# Patient Record
Sex: Female | Born: 1975 | Race: White | Hispanic: No | Smoking: Former smoker
Health system: Southern US, Community
[De-identification: ages and names within clinical notes are randomized; demographics above are authoritative.]

## PROBLEM LIST (undated history)

## (undated) DIAGNOSIS — K5790 Diverticulosis of intestine, part unspecified, without perforation or abscess without bleeding: Secondary | ICD-10-CM

## (undated) DIAGNOSIS — R0600 Dyspnea, unspecified: Secondary | ICD-10-CM

## (undated) DIAGNOSIS — M797 Fibromyalgia: Secondary | ICD-10-CM

## (undated) DIAGNOSIS — F909 Attention-deficit hyperactivity disorder, unspecified type: Secondary | ICD-10-CM

## (undated) DIAGNOSIS — M5030 Other cervical disc degeneration, unspecified cervical region: Secondary | ICD-10-CM

## (undated) DIAGNOSIS — K219 Gastro-esophageal reflux disease without esophagitis: Secondary | ICD-10-CM

## (undated) DIAGNOSIS — F32A Depression, unspecified: Secondary | ICD-10-CM

## (undated) DIAGNOSIS — M5431 Sciatica, right side: Secondary | ICD-10-CM

## (undated) DIAGNOSIS — N63 Unspecified lump in unspecified breast: Secondary | ICD-10-CM

## (undated) DIAGNOSIS — O139 Gestational [pregnancy-induced] hypertension without significant proteinuria, unspecified trimester: Secondary | ICD-10-CM

## (undated) DIAGNOSIS — IMO0002 Reserved for concepts with insufficient information to code with codable children: Secondary | ICD-10-CM

## (undated) DIAGNOSIS — F419 Anxiety disorder, unspecified: Secondary | ICD-10-CM

## (undated) DIAGNOSIS — G43909 Migraine, unspecified, not intractable, without status migrainosus: Secondary | ICD-10-CM

## (undated) DIAGNOSIS — B977 Papillomavirus as the cause of diseases classified elsewhere: Secondary | ICD-10-CM

## (undated) DIAGNOSIS — F329 Major depressive disorder, single episode, unspecified: Secondary | ICD-10-CM

## (undated) DIAGNOSIS — Z8739 Personal history of other diseases of the musculoskeletal system and connective tissue: Secondary | ICD-10-CM

## (undated) DIAGNOSIS — D649 Anemia, unspecified: Secondary | ICD-10-CM

## (undated) DIAGNOSIS — R87619 Unspecified abnormal cytological findings in specimens from cervix uteri: Secondary | ICD-10-CM

## (undated) HISTORY — DX: Migraine, unspecified, not intractable, without status migrainosus: G43.909

## (undated) HISTORY — DX: Fibromyalgia: M79.7

## (undated) HISTORY — DX: Depression, unspecified: F32.A

## (undated) HISTORY — PX: DILATION AND CURETTAGE OF UTERUS: SHX78

## (undated) HISTORY — DX: Diverticulosis of intestine, part unspecified, without perforation or abscess without bleeding: K57.90

## (undated) HISTORY — DX: Anxiety disorder, unspecified: F41.9

## (undated) HISTORY — DX: Unspecified lump in unspecified breast: N63.0

## (undated) HISTORY — PX: OTHER SURGICAL HISTORY: SHX169

## (undated) HISTORY — PX: HAND SURGERY: SHX662

## (undated) HISTORY — DX: Sciatica, right side: M54.31

## (undated) HISTORY — DX: Reserved for concepts with insufficient information to code with codable children: IMO0002

## (undated) HISTORY — DX: Attention-deficit hyperactivity disorder, unspecified type: F90.9

## (undated) HISTORY — DX: Other cervical disc degeneration, unspecified cervical region: M50.30

## (undated) HISTORY — DX: Major depressive disorder, single episode, unspecified: F32.9

## (undated) HISTORY — PX: ABDOMINAL HYSTERECTOMY: SHX81

## (undated) HISTORY — DX: Unspecified abnormal cytological findings in specimens from cervix uteri: R87.619

---

## 2001-05-17 ENCOUNTER — Other Ambulatory Visit: Admission: RE | Admit: 2001-05-17 | Discharge: 2001-05-17 | Payer: Self-pay | Admitting: Obstetrics and Gynecology

## 2001-07-03 ENCOUNTER — Emergency Department (HOSPITAL_COMMUNITY): Admission: EM | Admit: 2001-07-03 | Discharge: 2001-07-04 | Payer: Self-pay | Admitting: *Deleted

## 2001-07-03 ENCOUNTER — Encounter: Payer: Self-pay | Admitting: *Deleted

## 2001-07-08 ENCOUNTER — Ambulatory Visit (HOSPITAL_COMMUNITY): Admission: RE | Admit: 2001-07-08 | Discharge: 2001-07-08 | Payer: Self-pay | Admitting: Orthopaedic Surgery

## 2001-08-21 ENCOUNTER — Encounter (HOSPITAL_COMMUNITY): Admission: RE | Admit: 2001-08-21 | Discharge: 2001-09-20 | Payer: Self-pay | Admitting: Orthopaedic Surgery

## 2002-06-23 ENCOUNTER — Encounter: Payer: Self-pay | Admitting: Family Medicine

## 2002-06-23 ENCOUNTER — Ambulatory Visit (HOSPITAL_COMMUNITY): Admission: RE | Admit: 2002-06-23 | Discharge: 2002-06-23 | Payer: Self-pay | Admitting: Family Medicine

## 2004-03-08 ENCOUNTER — Inpatient Hospital Stay (HOSPITAL_COMMUNITY): Admission: AD | Admit: 2004-03-08 | Discharge: 2004-03-10 | Payer: Self-pay | Admitting: Internal Medicine

## 2004-08-18 ENCOUNTER — Ambulatory Visit (HOSPITAL_COMMUNITY): Admission: RE | Admit: 2004-08-18 | Discharge: 2004-08-18 | Payer: Self-pay | Admitting: Obstetrics & Gynecology

## 2004-10-13 ENCOUNTER — Ambulatory Visit (HOSPITAL_COMMUNITY): Admission: RE | Admit: 2004-10-13 | Discharge: 2004-10-13 | Payer: Self-pay | Admitting: Obstetrics & Gynecology

## 2004-11-17 ENCOUNTER — Ambulatory Visit (HOSPITAL_COMMUNITY): Admission: RE | Admit: 2004-11-17 | Discharge: 2004-11-17 | Payer: Self-pay | Admitting: Family Medicine

## 2005-01-18 ENCOUNTER — Ambulatory Visit (HOSPITAL_COMMUNITY): Admission: RE | Admit: 2005-01-18 | Discharge: 2005-01-18 | Payer: Self-pay | Admitting: Family Medicine

## 2005-01-25 ENCOUNTER — Ambulatory Visit (HOSPITAL_COMMUNITY): Admission: RE | Admit: 2005-01-25 | Discharge: 2005-01-25 | Payer: Self-pay | Admitting: Family Medicine

## 2005-02-27 ENCOUNTER — Ambulatory Visit: Payer: Self-pay | Admitting: Internal Medicine

## 2005-03-30 ENCOUNTER — Ambulatory Visit: Payer: Self-pay | Admitting: Internal Medicine

## 2006-11-19 ENCOUNTER — Ambulatory Visit (HOSPITAL_COMMUNITY): Admission: RE | Admit: 2006-11-19 | Discharge: 2006-11-19 | Payer: Self-pay | Admitting: Family Medicine

## 2006-11-21 ENCOUNTER — Ambulatory Visit (HOSPITAL_COMMUNITY): Admission: RE | Admit: 2006-11-21 | Discharge: 2006-11-21 | Payer: Self-pay | Admitting: Family Medicine

## 2006-11-30 ENCOUNTER — Ambulatory Visit (HOSPITAL_COMMUNITY): Admission: RE | Admit: 2006-11-30 | Discharge: 2006-11-30 | Payer: Self-pay | Admitting: Family Medicine

## 2008-02-28 ENCOUNTER — Encounter (HOSPITAL_COMMUNITY): Admission: RE | Admit: 2008-02-28 | Discharge: 2008-03-29 | Payer: Self-pay | Admitting: Neurology

## 2008-03-19 ENCOUNTER — Ambulatory Visit (HOSPITAL_COMMUNITY): Admission: RE | Admit: 2008-03-19 | Discharge: 2008-03-19 | Payer: Self-pay | Admitting: Obstetrics and Gynecology

## 2008-06-16 ENCOUNTER — Other Ambulatory Visit: Admission: RE | Admit: 2008-06-16 | Discharge: 2008-06-16 | Payer: Self-pay | Admitting: Obstetrics & Gynecology

## 2009-06-29 ENCOUNTER — Other Ambulatory Visit: Admission: RE | Admit: 2009-06-29 | Discharge: 2009-06-29 | Payer: Self-pay | Admitting: Obstetrics & Gynecology

## 2009-11-12 ENCOUNTER — Ambulatory Visit (HOSPITAL_COMMUNITY): Admission: RE | Admit: 2009-11-12 | Discharge: 2009-11-12 | Payer: Self-pay | Admitting: Neurology

## 2010-06-03 ENCOUNTER — Emergency Department (HOSPITAL_COMMUNITY): Admission: EM | Admit: 2010-06-03 | Discharge: 2010-06-04 | Payer: Self-pay | Admitting: Emergency Medicine

## 2010-10-23 NOTE — L&D Delivery Note (Signed)
Delivery Note At 9:32 PM a viable female was delivered via Vaginal, Spontaneous Delivery (Presentation: ;LOA  ).  APGAR: , ; weight .   Placenta status:spont Intact, Spontaneous.3vc  Cord:  with the following complications:none .    Anesthesia:  none Episiotomy: none Lacerations: none Suture Repair: none Est. Blood Loss300 (mL):   Mom to postpartum.  Baby to nursery-stable.  Zerita Boers 07/23/2011, 9:46 PM

## 2010-12-08 LAB — ABO/RH: RH Type: POSITIVE

## 2010-12-08 LAB — HIV ANTIBODY (ROUTINE TESTING W REFLEX): HIV: NONREACTIVE

## 2011-01-02 ENCOUNTER — Other Ambulatory Visit: Payer: Self-pay | Admitting: Obstetrics & Gynecology

## 2011-01-02 ENCOUNTER — Other Ambulatory Visit (HOSPITAL_COMMUNITY)
Admission: RE | Admit: 2011-01-02 | Discharge: 2011-01-02 | Disposition: A | Discharge status: Home / Self Care | Payer: Medicaid Other | Source: Ambulatory Visit | Attending: Obstetrics & Gynecology | Admitting: Obstetrics & Gynecology

## 2011-01-02 DIAGNOSIS — Z113 Encounter for screening for infections with a predominantly sexual mode of transmission: Secondary | ICD-10-CM | POA: Insufficient documentation

## 2011-01-02 DIAGNOSIS — Z01419 Encounter for gynecological examination (general) (routine) without abnormal findings: Secondary | ICD-10-CM | POA: Insufficient documentation

## 2011-01-06 LAB — URINALYSIS, ROUTINE W REFLEX MICROSCOPIC
Leukocytes, UA: NEGATIVE
Protein, ur: NEGATIVE mg/dL
Urobilinogen, UA: 0.2 mg/dL (ref 0.0–1.0)

## 2011-01-06 LAB — POCT I-STAT, CHEM 8
BUN: 8 mg/dL (ref 6–23)
Calcium, Ion: 1.14 mmol/L (ref 1.12–1.32)
Creatinine, Ser: 0.8 mg/dL (ref 0.4–1.2)
Glucose, Bld: 92 mg/dL (ref 70–99)
TCO2: 27 mmol/L (ref 0–100)

## 2011-01-06 LAB — URINE MICROSCOPIC-ADD ON

## 2011-03-10 NOTE — H&P (Signed)
NAME:  Kathryn Golden, Kathryn Golden                             ACCOUNT NO.:  [id number]   MEDICAL RECORD NO.:  [id number]                  PATIENT TYPE:   LOCATION:                                       FACILITY:   PHYSICIAN:  Lazaro Arms, M.D.                DATE OF BIRTH:  1976-07-03   DATE OF ADMISSION:  DATE OF DISCHARGE:                                HISTORY & PHYSICAL   HISTORY:  The patient is a 35 year old white female gravida 4, para 1,  abortus 2, with estimated date of delivery of 03/27/2004 currently 37-2/[redacted]  weeks gestation who presented to the office this morning complaining of  spontaneous rupture of membranes at approximately 6:30 this morning. She is  not reporting any labor.  In evaluation in the office she is obviously  ruptured with clear amniotic fluid. She is group B strep positive from  previous pregnancy.  As a result, she is admitted for antibiotic prophylaxis  and induction of labor with expected management.   PAST SURGICAL HISTORY:  In the right hand.   PAST OBSTETRICAL HISTORY:  A vaginal delivery in 2002, 7 pounds 15 ounces, 6  hours.  She has had 2 miscarriages.   ALLERGIES:  None.   MEDICATIONS:  Prenatal vitamins.   REVIEW OF SYSTEMS:  Negative.  Blood type is O positive.  Antibody screen is  negative.  HIV is nonreactive.  Hepatitis B was negative.  Rubella is  immune.  Pap was normal.  Serology was nonreactive.  GC and Chlamydia were  negative.  AFP was normal.  Glucola was normal; and group B strep was prior  positive.   PHYSICAL EXAMINATION:  HEENT:  Unremarkable.  NECK:  Thyroid is normal.  VITAL SIGNS:  Weight 146 pounds.  Blood pressure 130/70.  HEENT:  Unremarkable.  BREASTS:  Exam deferred.  ABDOMEN:  Fundal height 37 cm.  Cervix is 250 minus 1 to minus 2 vertex,  firm, midplane.  EXTREMITIES: Warm no edema.   IMPRESSION:  1. An intrauterine pregnancy at 37-2/[redacted] weeks gestation.  2. Spontaneous rupture of membranes.  3. No labor.   PLAN:   The patient is admitted for antibiotic prophylaxis for positive group  B strep and also induction of labor with Pitocin. She will request an  epidural at the appropriate time.     ___________________________________________                                         Lazaro Arms, M.D.   Loraine Maple  D:  03/08/2004  T:  03/08/2004  Job:  [id number]

## 2011-03-10 NOTE — Op Note (Signed)
NAME:  Kathryn Golden, Kathryn Golden                 ACCOUNT NO.:  [id number]   MEDICAL RECORD NO.:  [id number]          PATIENT TYPE:  AMB   LOCATION:  DAY                           FACILITY:  [facility]   PHYSICIAN:  Lazaro Arms, M.D.   DATE OF BIRTH:  1976-06-08   DATE OF PROCEDURE:  08/18/2004  DATE OF DISCHARGE:                                 OPERATIVE REPORT   PREOPERATIVE DIAGNOSES:  1.  Menometrorrhagia.  2.  Possible retained fragment of placenta.   POSTOPERATIVE DIAGNOSES:  1.  Menometrorrhagia.  2.  Possible retained fragment of placenta.   PROCEDURE:  Hysteroscopy and dilatation and curettage.   SURGEON:  Lazaro Arms, M.D.   ANESTHESIA:  Laryngeal mask airway.   FINDINGS:  The patient delivered several months ago and did well postpartum,  and she began to have unusual heavy bleeding when she started birth control  pills.  I put her on Megace to get her bleeding stopped, which worked  nicely, did an ultrasound which showed a little tiny area of calcification  in the anterior endometrial stripe, but she responded so nicely to the  Megace, I thought that there probably was not a retained fragment.  She  stayed on the Megace for a couple of months, came off of it and started  bleeding again, and now I think it probably is.  As a result, she is  admitted for hysteroscopy, D&C.   During the hysteroscopy, there was some shaggy-looking tissue on the  anterior uterine wall about midway the anterior uterine wall, not at the  fundus.  There probably was a very small retained placental fragment of  tissue.  It was removed with the D&C.   DESCRIPTION OF OPERATION:  The patient was taken to the operating room and  placed in the supine position, where she underwent laryngeal mask airway.  She was then placed in the dorsal lithotomy position and prepped and draped  in the usual fashion.  The bladder was drained, a Graves speculum was  placed.  The cervix was grasped with a single-tooth  tenaculum.  Marcaine  0.5% was injected as a paracervical block 20 mL total.  The cervix was  dilated, hysteroscope was placed, and the above-noted findings were seen.  A  vigorous uterine curettage was performed, and there was good uterine cry  attained in all areas.  A look with the hysteroscope again, and the fragment  and all the  tissue with it had been removed.  She was down to white endometrium.  As a  result, the procedure was found to be finished.  It went well without  difficulty.  The patient was awakened from anesthesia, taken to recovery in  good, stable condition, all counts correct.  She received Ancef and Toradol  prophylactically.     Luth   LHE/MEDQ  D:  08/18/2004  T:  08/18/2004  Job:  [id number]

## 2011-03-10 NOTE — Op Note (Signed)
NAME:  [name], [name]                           ACCOUNT NO.:  [id number]   MEDICAL RECORD NO.:  [id number]                   PATIENT TYPE:  INP   LOCATION:  A417                                 FACILITY:  [facility]   PHYSICIAN:  Tilda Burrow, M.D.              DATE OF BIRTH:  11-13-1975   DATE OF PROCEDURE:  DATE OF DISCHARGE:                                 OPERATIVE REPORT   DELIVERY NOTE:  Kathryn Golden progressed steadily through labor, receiving her  epidural at approximately 1800 when she was 4 cm dilated and after Pitocin  augmentation, was noted to be fully dilated at approximately 1925.  She was  having variable decelerations of the fetal heart rate with each contraction  down to about the 90s with return to baseline before the end of the  contractions.  After a very brief 10 minute second stage, she delivered a  viable female infant at 8.  Apgars were 9 and 9; weight was 6 pounds 8.6  ounces.  There was a nuchal cord that was reduced before delivery of the  body.  The nose and mouth were suctioned on the perineum and the body  delivered without difficulty.  Pitocin 20 units diluted in 1000 mL of  lactated Ringer's was then infused rapidly IV.  The placenta separated  spontaneously and was delivered via controlled cord traction at 1955.  It  was inspected and appeared to be intact with a three-vessel cord.  Lochia  flow was minimal.  The epidural catheter was then removed with the blue tip  visualized as being intact.  Estimated blood loss 200 mL.  The vagina was  inspected, and no lacerations were found.     ________________________________________  ___________________________________________  Jacklyn Shell, C.N.M.           Tilda Burrow, M.D.   FC/MEDQ  D:  03/08/2004  T:  03/09/2004  Job:  [id number]

## 2011-03-10 NOTE — Op Note (Signed)
NAME:  Kathryn Golden, Kathryn Golden                           ACCOUNT NO.:  [id number]   MEDICAL RECORD NO.:  [id number]                   PATIENT TYPE:  INP   LOCATION:  A411                                 FACILITY:  [facility]   PHYSICIAN:  Tilda Burrow, M.D.              DATE OF BIRTH:  1976/07/13   DATE OF PROCEDURE:  03/08/2004  DATE OF DISCHARGE:  03/10/2004                                 OPERATIVE REPORT   PROCEDURE:  Epidural note, 6 p.m., Mar 08, 2004   DETAILS OF PROCEDURE:  The patient was placed in the sitting position,  flexed forward, and back prepped and draped.  Loss of resistance technique  was used to identify the epidural space on the first attempt with standard  loss of resistance technique.  Then 5 mL of 1.5% lidocaine with epinephrine  was instilled followed by placement of the epidural catheter 3 cm into the  epidural space, 2 at removal of the Tuohy needle done by taping the catheter  to the back and infusing a 10 mL bolus of the standard epidural mix of  0.125% Marcaine with fentanyl.  Subsequently, she was placed on 12 mL/h of  continuous infusion with good analgesic relief at T10 level.  The patient  tolerated the procedure well and continued with labor management as  documented elsewhere.      ___________________________________________                                            Tilda Burrow, M.D.   JVF/MEDQ  D:  03/30/2004  T:  03/30/2004  Job:  [id number]

## 2011-07-11 LAB — GC/CHLAMYDIA PROBE AMP, GENITAL
Chlamydia: NEGATIVE
Gonorrhea: NEGATIVE

## 2011-07-18 ENCOUNTER — Encounter (HOSPITAL_COMMUNITY): Payer: Self-pay | Admitting: *Deleted

## 2011-07-18 ENCOUNTER — Inpatient Hospital Stay (HOSPITAL_COMMUNITY)
Admission: AD | Admit: 2011-07-18 | Discharge: 2011-07-19 | Disposition: A | Discharge status: Home / Self Care | Payer: Medicaid Other | Source: Ambulatory Visit | Attending: Obstetrics & Gynecology | Admitting: Obstetrics & Gynecology

## 2011-07-18 DIAGNOSIS — O99891 Other specified diseases and conditions complicating pregnancy: Secondary | ICD-10-CM | POA: Insufficient documentation

## 2011-07-18 DIAGNOSIS — R030 Elevated blood-pressure reading, without diagnosis of hypertension: Secondary | ICD-10-CM

## 2011-07-18 DIAGNOSIS — R0989 Other specified symptoms and signs involving the circulatory and respiratory systems: Secondary | ICD-10-CM

## 2011-07-18 LAB — PROTEIN / CREATININE RATIO, URINE: Total Protein, Urine: <4 mg/dL

## 2011-07-18 MED ORDER — OXYCODONE-ACETAMINOPHEN 5-325 MG PO TABS: 1.0000 | ORAL_TABLET | ORAL | Status: DC | PRN

## 2011-07-18 NOTE — Progress Notes (Signed)
Pt states she has a headache that she has had all day. Pt states she has had headaches since Sunday 0/23/2012

## 2011-07-18 NOTE — Progress Notes (Signed)
Pt states she was seen in office today and her b/p was 150/74, they got lab work and told her they would call her but she did not hear anything and she got worried. Pt reports she has had a headache off/on for 3 days and has had "flashes of light and blurred vision" for days also.

## 2011-07-18 NOTE — ED Provider Notes (Signed)
Kathryn Golden is a 35 y.o. female presenting for further eval of elevated BP from her office visit earlier today. Reports H/A since 9/23 and blurry vision x 2 wks. Denies leak or bldg. Reports +FM. Had blood drawn at [facility] earlier today but did not receive a phone call with results and she became concerned. Maternal Medical History:  Reason for admission: Reason for Admission:   nausea  OB History    Grav Para Term Preterm Abortions TAB SAB Ect Mult Living   5 2 2  0 2 0 2 0 0 2     No past medical history on file. Past Surgical History  Procedure Date  . Dilation and curretage    Family History: family history includes Cancer in her father. Social History:  reports that she has been smoking.  She does not have any smokeless tobacco history on file. She reports that she does not drink alcohol or use illicit drugs.  Review of Systems  Constitutional: Negative for fever.  Gastrointestinal: Negative for nausea and vomiting.      Blood pressure 138/71, pulse 83, temperature 98 F (36.7 C), resp. rate 18, height 5\' 7"  (1.702 m), weight 76.204 kg (168 lb). Serial BPs: 138-149/71-85 Maternal Exam:  Uterine Assessment: q 2-3 min ctx initially which spaced to irreg 6-8 at the end of her visit     Fetal Exam Fetal Monitor Review: Baseline rate: 130.  Variability: moderate (6-25 bpm).   Pattern: accelerations present and no decelerations.    Fetal State Assessment: Category I - tracings are normal.     Physical Exam  Constitutional: She is oriented to person, place, and time. She appears well-developed and well-nourished.  HENT:  Head: Normocephalic.  Cardiovascular: Normal rate.   Respiratory: Effort normal.  Musculoskeletal: Normal range of motion.  Neurological: She is alert and oriented to person, place, and time.  Psychiatric: She has a normal mood and affect. Her behavior is normal.    Urine Pro/Cr ratio: protein reported as <4.0 (rev'd with [place] CNM)  Prenatal  labs: ABO, Rh:   Antibody:   Rubella:   RPR:    HBsAg:    HIV:    GBS:     Assessment/Plan: IUP at 37.4 Labile BP  Offered medication for HA but pt declined. States she mostly wanted to eval her BP.  D/C home with preeclampsia precautions. FU at [facility] as sched or sooner if symptoms worsen.   Cam Hai 07/18/2011, 11:38 PM

## 2011-07-18 NOTE — Progress Notes (Signed)
EFM Strip reviewed by K. Clelia Croft CNM as second reviewer and removed from [facility].

## 2011-07-21 NOTE — ED Provider Notes (Signed)
Attestation of Attending Supervision of Advanced Practitioner: Evaluation and management procedures were performed by the PA/NP/CNM/OB Fellow under my supervision/collaboration. Chart reviewed and agree with management and plan.  Aikeem Lilley A 07/21/2011 3:13 PM   

## 2011-07-22 ENCOUNTER — Encounter (HOSPITAL_COMMUNITY): Payer: Self-pay | Admitting: *Deleted

## 2011-07-22 ENCOUNTER — Inpatient Hospital Stay (HOSPITAL_COMMUNITY)
Admission: AD | Admit: 2011-07-22 | Discharge: 2011-07-23 | Disposition: A | Discharge status: Home / Self Care | Payer: Medicaid Other | Source: Ambulatory Visit | Attending: Obstetrics & Gynecology | Admitting: Obstetrics & Gynecology

## 2011-07-22 DIAGNOSIS — E876 Hypokalemia: Secondary | ICD-10-CM | POA: Insufficient documentation

## 2011-07-22 DIAGNOSIS — R439 Unspecified disturbances of smell and taste: Secondary | ICD-10-CM | POA: Insufficient documentation

## 2011-07-22 DIAGNOSIS — O99891 Other specified diseases and conditions complicating pregnancy: Secondary | ICD-10-CM | POA: Insufficient documentation

## 2011-07-22 DIAGNOSIS — N898 Other specified noninflammatory disorders of vagina: Secondary | ICD-10-CM | POA: Insufficient documentation

## 2011-07-22 HISTORY — DX: Gestational (pregnancy-induced) hypertension without significant proteinuria, unspecified trimester: O13.9

## 2011-07-22 HISTORY — DX: Papillomavirus as the cause of diseases classified elsewhere: B97.7

## 2011-07-22 LAB — URINALYSIS, ROUTINE W REFLEX MICROSCOPIC
Glucose, UA: NEGATIVE mg/dL
Hgb urine dipstick: NEGATIVE
Leukocytes, UA: NEGATIVE
Nitrite: NEGATIVE
Protein, ur: NEGATIVE mg/dL
Specific Gravity, Urine: <1.005 — ABNORMAL LOW (ref 1.005–1.030)

## 2011-07-22 NOTE — Progress Notes (Signed)
C/o chest tightness today, states face goes numb, feels like a hair across face.  Also green vag d/c intermittently, continuous today.

## 2011-07-22 NOTE — ED Provider Notes (Signed)
History     Chief Complaint  Patient presents with  . Chest Pain  . Numbness   HPI Kathryn Golden is a 35 year old G5P2022 at [redacted]w[redacted]d presenting with perioral and right hand numbness as well as feeling like her right hand and right foot are weak x1-2 days.  More swelling in her right foot that is painful.  Also states her mouth feels twitchy.  Also feels chest tightness and shortness of breath that comes and goes.  Has a greenish to white vaginal discharge without vaginal itching or burning.  Denies loss of fluid and vaginal bleeding.  Having mild contractions.  Reports good fetal movement.  Does have some nausea, able to snack on small meals throughout the day.  Drinking lots of water.  OB History    Grav Para Term Preterm Abortions TAB SAB Ect Mult Living   5 2 2  0 2 0 2 0 0 2      Past Medical History  Diagnosis Date  . PIH (pregnancy induced hypertension)   . Endometriosis   . HPV (human papilloma virus) infection     Past Surgical History  Procedure Date  . Dilation and curretage   . Dilation and curettage of uterus   . Hand surgery     Family History  Problem Relation Age of Onset  . Cancer Father     History  Substance Use Topics  . Smoking status: Current Everyday Smoker -- 1.0 packs/day  . Smokeless tobacco: Never Used  . Alcohol Use: No    Allergies: No Known Allergies  Prescriptions prior to admission  Medication Sig Dispense Refill  . acetaminophen (TYLENOL) 500 MG tablet Take 500 mg by mouth every 6 (six) hours as needed. For pain or headache       . escitalopram (LEXAPRO) 20 MG tablet Take 20 mg by mouth daily.        Marland Kitchen esomeprazole (NEXIUM) 40 MG capsule Take 40 mg by mouth at bedtime.        . flintstones complete (FLINTSTONES) 60 MG chewable tablet Chew 2 tablets by mouth daily.        Marland Kitchen zolpidem (AMBIEN) 10 MG tablet Take 10 mg by mouth at bedtime as needed. For insomnia         Review of Systems  Constitutional: Negative for fever and chills.  Eyes:  Positive for blurred vision (present for several weeks).  Respiratory: Positive for shortness of breath. Negative for cough.   Cardiovascular: Negative for chest pain and palpitations.  Gastrointestinal: Positive for nausea and diarrhea (watery to loose stools for last week). Negative for vomiting, abdominal pain and constipation.  Genitourinary: Negative for dysuria.  Skin: Negative for rash.  Neurological: Positive for tingling. Negative for headaches.   Physical Exam   Blood pressure 140/93, pulse 95, resp. rate 24, height 5\' 7"  (1.702 m), weight 171 lb (77.565 kg), SpO2 99.00%.  Physical Exam  Constitutional: She is oriented to person, place, and time. She appears well-developed and well-nourished. She appears distressed (appears mildly short of breath).  HENT:  Head: Normocephalic and atraumatic.  Mouth/Throat: Oropharynx is clear and moist. No oropharyngeal exudate.  Eyes: No scleral icterus.  Neck: Normal range of motion. Neck supple.  Cardiovascular: Normal rate, regular rhythm, normal heart sounds and intact distal pulses.  Exam reveals no gallop.   No murmur heard. Respiratory: Effort normal and breath sounds normal. She has no wheezes. She has no rales.       Appears mildly  short of breath   GI: Soft. Bowel sounds are normal. There is no tenderness.       Gravid with size consistent with dates  Genitourinary:       Normal external genitalia.  Normal vagina.  Thin white/greenish discharge present.    Musculoskeletal: She exhibits edema (pedal edema bilaterally, worse on right than left) and tenderness (dorsum of right foot).  Neurological: She is alert and oriented to person, place, and time.       No facial asymmetry.  Decreased peri-oral sensation. Mild right facial twitch with tapping on right facial nerve; not consistent.  Strength 5/5 in all four extremities.   Skin: Skin is warm and dry. No rash noted.  Psychiatric: She has a normal mood and affect. Her behavior is  normal.  Dilation: 2.5 Effacement (%): 50 Presentation: Vertex Exam by:: dr booth  FHT: 120, moderate variability, accels present, decels absent Contractions: every 5 minutes  MAU Course  Procedures Fern test: negative Wet Prep: no yeast, trich, or clue cells; few WBCs Urine dipstick shows positive for ketones, negative for protein. CBC    Component Value Date/Time   WBC 9.8 07/22/2011 2350   RBC 3.76* 07/22/2011 2350   HGB 11.4* 07/22/2011 2350   HCT 34.4* 07/22/2011 2350   PLT 189 07/22/2011 2350   MCV 91.5 07/22/2011 2350   MCH 30.3 07/22/2011 2350   MCHC 33.1 07/22/2011 2350   RDW 13.3 07/22/2011 2350   CMP     Component Value Date/Time   NA 136 07/22/2011 2350   K 2.2* 07/22/2011 2350   CL 101 07/22/2011 2350   CO2 26 07/22/2011 2350   GLUCOSE 84 07/22/2011 2350   BUN 3* 07/22/2011 2350   CREATININE <0.47* 07/22/2011 2350   CALCIUM 8.8 07/22/2011 2350   PROT 6.4 07/22/2011 2350   ALBUMIN 2.6* 07/22/2011 2350   AST 24 07/22/2011 2350   ALT 13 07/22/2011 2350   ALKPHOS 149* 07/22/2011 2350   BILITOT 0.3 07/22/2011 2350   GFRNONAA NOT CALCULATED 07/22/2011 2350   GFRAA NOT CALCULATED 07/22/2011 2350    MDM Patient with multiple complaints.  Respiratory complaints not worrisome given normal lung exam and O2 sats >95%.  Vaginal discharge un-alarming.  Neurologic symptoms somewhat concerning, however no evidence for motor weakness or stroke on exam.  Some concern for hypocalcemia given perioral paresthesia and +/- Chvosteck's sign.  Hypokalemia on CMP, will give oral replacement with now.  Assessment and Plan  35 year old G5P2022 at 37.1 presenting with multiple concerns Rh+, rubella immune, GBS positive -vaginal discharge: physiologic -respiratory status: stable, likely secondary to pregnancy -peri-oral numbness: unclear etiology, however no red flags for TIA/stroke -hypokalemia: prior to discharge; start PO daily x5 days -discharge home -follow up at [facility] as  scheduled on Monday   BOOTH, Avari Nevares 07/22/2011, 11:18 PM

## 2011-07-23 ENCOUNTER — Inpatient Hospital Stay (HOSPITAL_COMMUNITY)
Admission: AD | Admit: 2011-07-23 | Discharge: 2011-07-25 | DRG: 767 | Disposition: A | Discharge status: Home / Self Care | Payer: Medicaid Other | Source: Ambulatory Visit | Attending: Obstetrics & Gynecology | Admitting: Obstetrics & Gynecology

## 2011-07-23 ENCOUNTER — Encounter (HOSPITAL_COMMUNITY): Payer: Self-pay | Admitting: *Deleted

## 2011-07-23 DIAGNOSIS — Z302 Encounter for sterilization: Secondary | ICD-10-CM

## 2011-07-23 LAB — COMPREHENSIVE METABOLIC PANEL WITH GFR
ALT: 13 U/L (ref 0–35)
AST: 24 U/L (ref 0–37)
Albumin: 2.6 g/dL — ABNORMAL LOW (ref 3.5–5.2)
Alkaline Phosphatase: 149 U/L — ABNORMAL HIGH (ref 39–117)
BUN: 3 mg/dL — ABNORMAL LOW (ref 6–23)
CO2: 26 meq/L (ref 19–32)
Calcium: 8.8 mg/dL (ref 8.4–10.5)
Chloride: 101 meq/L (ref 96–112)
Creatinine, Ser: <0.47 mg/dL — ABNORMAL LOW (ref 0.50–1.10)
Glucose, Bld: 84 mg/dL (ref 70–99)
Potassium: 2.2 meq/L — CL (ref 3.5–5.1)
Sodium: 136 meq/L (ref 135–145)
Total Bilirubin: 0.3 mg/dL (ref 0.3–1.2)
Total Protein: 6.4 g/dL (ref 6.0–8.3)

## 2011-07-23 LAB — CBC
MCH: 30.3 pg (ref 26.0–34.0)
MCV: 91.5 fL (ref 78.0–100.0)
Platelets: 189 10*3/uL (ref 150–400)
RDW: 13.3 % (ref 11.5–15.5)

## 2011-07-23 LAB — COMPREHENSIVE METABOLIC PANEL
Alkaline Phosphatase: 162 U/L — ABNORMAL HIGH (ref 39–117)
BUN: <3 mg/dL — ABNORMAL LOW (ref 6–23)
CO2: 26 mEq/L (ref 19–32)
Chloride: 103 mEq/L (ref 96–112)
Creatinine, Ser: <0.47 mg/dL — ABNORMAL LOW (ref 0.50–1.10)
Glucose, Bld: 112 mg/dL — ABNORMAL HIGH (ref 70–99)
Total Bilirubin: 0.4 mg/dL (ref 0.3–1.2)

## 2011-07-23 LAB — WET PREP, GENITAL
Clue Cells Wet Prep HPF POC: NONE SEEN
Trich, Wet Prep: NONE SEEN
Yeast Wet Prep HPF POC: NONE SEEN

## 2011-07-23 MED ORDER — OXYTOCIN 20 UNITS IN LACTATED RINGERS INFUSION - SIMPLE: 125.0000 mL/h | Freq: Once | INTRAVENOUS | Status: DC

## 2011-07-23 MED ORDER — OXYCODONE-ACETAMINOPHEN 5-325 MG PO TABS: 2.0000 | ORAL_TABLET | ORAL | Status: DC | PRN

## 2011-07-23 MED ORDER — DIBUCAINE 1 % RE OINT
1.0000 "application " | TOPICAL_OINTMENT | RECTAL | Status: DC | PRN
  Filled 2011-07-23: qty 28

## 2011-07-23 MED ORDER — WITCH HAZEL-GLYCERIN EX PADS: 1.0000 "application " | MEDICATED_PAD | CUTANEOUS | Status: DC | PRN

## 2011-07-23 MED ORDER — FLEET ENEMA 7-19 GM/118ML RE ENEM: 1.0000 | ENEMA | RECTAL | Status: DC | PRN

## 2011-07-23 MED ORDER — LANOLIN HYDROUS EX OINT: TOPICAL_OINTMENT | CUTANEOUS | Status: DC | PRN

## 2011-07-23 MED ORDER — CITRIC ACID-SODIUM CITRATE 334-500 MG/5ML PO SOLN: 30.0000 mL | ORAL | Status: DC | PRN

## 2011-07-23 MED ORDER — ESCITALOPRAM OXALATE 20 MG PO TABS
20.0000 mg | ORAL_TABLET | Freq: Every day | ORAL | Status: DC
  Administered 2011-07-24 – 2011-07-25 (×3): 20 mg via ORAL
  Filled 2011-07-23 (×4): qty 1

## 2011-07-23 MED ORDER — ONDANSETRON HCL 4 MG/2ML IJ SOLN: 4.0000 mg | INTRAMUSCULAR | Status: DC | PRN

## 2011-07-23 MED ORDER — ONDANSETRON HCL 4 MG PO TABS: 4.0000 mg | ORAL_TABLET | ORAL | Status: DC | PRN

## 2011-07-23 MED ORDER — LACTATED RINGERS IV SOLN
500.0000 mL | INTRAVENOUS | Status: DC | PRN
Start: 2011-07-23 — End: 2011-07-23

## 2011-07-23 MED ORDER — SIMETHICONE 80 MG PO CHEW
80.0000 mg | CHEWABLE_TABLET | ORAL | Status: DC | PRN
  Administered 2011-07-24: 80 mg via ORAL

## 2011-07-23 MED ORDER — IBUPROFEN 600 MG PO TABS: 600.0000 mg | ORAL_TABLET | Freq: Four times a day (QID) | ORAL | Status: DC | PRN

## 2011-07-23 MED ORDER — LACTATED RINGERS IV SOLN
INTRAVENOUS | Status: DC
Start: 2011-07-23 — End: 2011-07-23

## 2011-07-23 MED ORDER — SODIUM CHLORIDE 0.9 % IV SOLN
2.0000 g | Freq: Once | INTRAVENOUS | Status: DC
  Filled 2011-07-23: qty 2000

## 2011-07-23 MED ORDER — ZOLPIDEM TARTRATE 5 MG PO TABS: 5.0000 mg | ORAL_TABLET | Freq: Every evening | ORAL | Status: DC | PRN

## 2011-07-23 MED ORDER — LIDOCAINE HCL (PF) 1 % IJ SOLN: 30.0000 mL | INTRAMUSCULAR | Status: DC | PRN

## 2011-07-23 MED ORDER — TETANUS-DIPHTH-ACELL PERTUSSIS 5-2.5-18.5 LF-MCG/0.5 IM SUSP
0.5000 mL | Freq: Once | INTRAMUSCULAR | Status: AC
  Administered 2011-07-25: 0.5 mL via INTRAMUSCULAR
  Filled 2011-07-23 (×2): qty 0.5

## 2011-07-23 MED ORDER — PANTOPRAZOLE SODIUM 40 MG PO TBEC
80.0000 mg | DELAYED_RELEASE_TABLET | Freq: Every day | ORAL | Status: DC
  Administered 2011-07-24: 80 mg via ORAL
  Filled 2011-07-23 (×4): qty 2

## 2011-07-23 MED ORDER — PRENATAL PLUS 27-1 MG PO TABS
1.0000 | ORAL_TABLET | Freq: Every day | ORAL | Status: DC
  Administered 2011-07-24 – 2011-07-25 (×2): 1 via ORAL
  Filled 2011-07-23 (×5): qty 1

## 2011-07-23 MED ORDER — OXYTOCIN BOLUS FROM INFUSION
500.0000 mL | Freq: Once | INTRAVENOUS | Status: DC
  Filled 2011-07-23: qty 500

## 2011-07-23 MED ORDER — SENNOSIDES-DOCUSATE SODIUM 8.6-50 MG PO TABS
2.0000 | ORAL_TABLET | Freq: Every day | ORAL | Status: DC
  Administered 2011-07-24 (×2): 2 via ORAL

## 2011-07-23 MED ORDER — POTASSIUM CHLORIDE ER 10 MEQ PO TBCR: 20.0000 meq | EXTENDED_RELEASE_TABLET | Freq: Every day | ORAL | Status: DC

## 2011-07-23 MED ORDER — IBUPROFEN 600 MG PO TABS
600.0000 mg | ORAL_TABLET | Freq: Four times a day (QID) | ORAL | Status: DC
  Administered 2011-07-24 – 2011-07-25 (×7): 600 mg via ORAL
  Filled 2011-07-23 (×7): qty 1

## 2011-07-23 MED ORDER — POTASSIUM CHLORIDE CRYS ER 20 MEQ PO TBCR
40.0000 meq | EXTENDED_RELEASE_TABLET | Freq: Two times a day (BID) | ORAL | Status: DC
  Administered 2011-07-24 – 2011-07-25 (×3): 40 meq via ORAL
  Filled 2011-07-23 (×6): qty 2

## 2011-07-23 MED ORDER — OXYCODONE-ACETAMINOPHEN 5-325 MG PO TABS: 1.0000 | ORAL_TABLET | ORAL | Status: DC | PRN

## 2011-07-23 MED ORDER — BENZOCAINE-MENTHOL 20-0.5 % EX AERO
1.0000 "application " | INHALATION_SPRAY | CUTANEOUS | Status: DC | PRN
  Filled 2011-07-23: qty 56

## 2011-07-23 MED ORDER — ACETAMINOPHEN 325 MG PO TABS: 650.0000 mg | ORAL_TABLET | ORAL | Status: DC | PRN

## 2011-07-23 MED ORDER — ONDANSETRON HCL 4 MG/2ML IJ SOLN: 4.0000 mg | Freq: Four times a day (QID) | INTRAMUSCULAR | Status: DC | PRN

## 2011-07-23 MED ORDER — POTASSIUM CHLORIDE CRYS ER 20 MEQ PO TBCR
40.0000 meq | EXTENDED_RELEASE_TABLET | Freq: Once | ORAL | Status: AC
  Administered 2011-07-23: 40 meq via ORAL
  Filled 2011-07-23: qty 2

## 2011-07-23 MED ORDER — DIPHENHYDRAMINE HCL 25 MG PO CAPS: 25.0000 mg | ORAL_CAPSULE | Freq: Four times a day (QID) | ORAL | Status: DC | PRN

## 2011-07-23 NOTE — Progress Notes (Signed)
Baby to breast at this time.

## 2011-07-23 NOTE — Progress Notes (Signed)
Notified of continued elevated BP's.  BP's reviewed over phone with CNM.  Orders for cmet and UA.

## 2011-07-23 NOTE — Progress Notes (Signed)
Grossly ruptured on admission. Pt states she was leaking last night but not sure when she ruptured.

## 2011-07-23 NOTE — Progress Notes (Signed)
Pt brought straight back from lobby with c/o labor and feeling the urge to push.

## 2011-07-23 NOTE — Progress Notes (Signed)
Kathryn Golden, [facility] transporting pt. To room 106 via wheelchair.

## 2011-07-23 NOTE — Consult Note (Signed)
Reviewed HPI/Exam/labs with Dr. Debroah Loop ok to discharge home with PO potassium with follow-up in her primary care office this week; no other diagnostic tests indicated at this time due to normal exam.

## 2011-07-23 NOTE — Significant Event (Signed)
Dr booth is informed of critical level of potassium 2.2. No order received at this time.

## 2011-07-23 NOTE — Progress Notes (Signed)
EFM and toco appled. Delivery imminent.

## 2011-07-23 NOTE — Progress Notes (Signed)
I&O cath collected for protein per cnm orders.

## 2011-07-23 NOTE — H&P (Signed)
Kathryn Golden is a 35 y.o. female presenting for active labor. Maternal Medical History:  Reason for admission: Reason for admission: contractions.  Contractions: Onset was 3-5 hours ago.   Frequency: regular.    Fetal activity: Perceived fetal activity is normal.   Last perceived fetal movement was within the past hour.    Prenatal complications: no prenatal complications   OB History    Grav Para Term Preterm Abortions TAB SAB Ect Mult Living   5 2 2  0 2 0 2 0 0 2     Past Medical History  Diagnosis Date  . PIH (pregnancy induced hypertension)   . Endometriosis   . HPV (human papilloma virus) infection    Past Surgical History  Procedure Date  . Dilation and curretage   . Dilation and curettage of uterus   . Hand surgery    Family History: family history includes Cancer in her father. Social History:  reports that she has been smoking.  She has never used smokeless tobacco. She reports that she does not drink alcohol or use illicit drugs.  Review of Systems  Constitutional: Negative.   HENT: Negative.   Eyes: Negative.   Respiratory: Negative.   Cardiovascular: Negative.   Gastrointestinal: Negative.   Genitourinary: Negative.   Musculoskeletal: Negative.   Skin: Negative.   Neurological: Negative.   Endo/Heme/Allergies: Negative.   Psychiatric/Behavioral: Negative.       There were no vitals taken for this visit. Maternal Exam:  Uterine Assessment: Contraction strength is firm.  Contraction frequency is regular.   Abdomen: Patient reports no abdominal tenderness. Fetal presentation: vertex  Introitus: Normal vulva. Normal vagina.    Physical Exam  Constitutional: She is oriented to person, place, and time. She appears well-developed and well-nourished.  HENT:  Head: Normocephalic.  Cardiovascular: Normal rate, regular rhythm, normal heart sounds and intact distal pulses.   Respiratory: Effort normal and breath sounds normal.  GI: Soft. Bowel sounds are  normal.  Genitourinary: Vagina normal and uterus normal.  Musculoskeletal: Normal range of motion.  Neurological: She is alert and oriented to person, place, and time. She has normal reflexes.  Skin: Skin is warm and dry.  Psychiatric: She has a normal mood and affect. Her behavior is normal. Judgment and thought content normal.    Prenatal labs: ABO, Rh:   Antibody:   Rubella:   RPR:    HBsAg:    HIV:    GBS:     Assessment/Plan: Impending del   Zerita Boers 07/23/2011, 9:43 PM

## 2011-07-23 NOTE — Progress Notes (Signed)
CNM notified of pt VE.  En route for delivery.

## 2011-07-24 ENCOUNTER — Encounter (HOSPITAL_COMMUNITY): Payer: Self-pay | Admitting: Anesthesiology

## 2011-07-24 ENCOUNTER — Inpatient Hospital Stay (HOSPITAL_COMMUNITY): Payer: Medicaid Other | Admitting: Anesthesiology

## 2011-07-24 ENCOUNTER — Encounter (HOSPITAL_COMMUNITY)
Admission: AD | Disposition: A | Discharge status: Home / Self Care | Payer: Self-pay | Source: Ambulatory Visit | Attending: Obstetrics & Gynecology

## 2011-07-24 ENCOUNTER — Other Ambulatory Visit: Payer: Self-pay | Admitting: Obstetrics and Gynecology

## 2011-07-24 DIAGNOSIS — Z302 Encounter for sterilization: Secondary | ICD-10-CM

## 2011-07-24 HISTORY — PX: TUBAL LIGATION: SHX77

## 2011-07-24 LAB — PROTEIN / CREATININE RATIO, URINE: Creatinine, Urine: 33.21 mg/dL

## 2011-07-24 SURGERY — LIGATION, FALLOPIAN TUBE, POSTPARTUM
Anesthesia: Epidural | Site: Abdomen | Laterality: Bilateral | Wound class: Clean Contaminated

## 2011-07-24 MED ORDER — BUPIVACAINE IN DEXTROSE 0.75-8.25 % IT SOLN
INTRATHECAL | Status: DC | PRN
  Administered 2011-07-24: 14 mg via INTRATHECAL

## 2011-07-24 MED ORDER — LACTATED RINGERS IV SOLN
INTRAVENOUS | Status: DC
  Administered 2011-07-24: 16:00:00 via INTRAVENOUS
  Administered 2011-07-24: 20 mL/h via INTRAVENOUS

## 2011-07-24 MED ORDER — OXYCODONE-ACETAMINOPHEN 5-325 MG PO TABS
1.0000 | ORAL_TABLET | Freq: Four times a day (QID) | ORAL | Status: DC | PRN
  Administered 2011-07-24 – 2011-07-25 (×3): 1 via ORAL
  Filled 2011-07-24 (×3): qty 1

## 2011-07-24 MED ORDER — KETOROLAC TROMETHAMINE 30 MG/ML IJ SOLN
INTRAMUSCULAR | Status: AC
  Filled 2011-07-24: qty 1

## 2011-07-24 MED ORDER — INFLUENZA VIRUS VACC SPLIT PF IM SUSP
0.5000 mL | Freq: Once | INTRAMUSCULAR | Status: AC
  Administered 2011-07-25: 0.5 mL via INTRAMUSCULAR
  Filled 2011-07-24: qty 0.5

## 2011-07-24 MED ORDER — LANOLIN HYDROUS EX OINT: TOPICAL_OINTMENT | CUTANEOUS | Status: DC | PRN

## 2011-07-24 MED ORDER — KETOROLAC TROMETHAMINE 30 MG/ML IJ SOLN
INTRAMUSCULAR | Status: DC | PRN
  Administered 2011-07-24: 30 mg via INTRAVENOUS

## 2011-07-24 MED ORDER — ACETAMINOPHEN 500 MG PO TABS: 1000.0000 mg | ORAL_TABLET | Freq: Four times a day (QID) | ORAL | Status: DC | PRN

## 2011-07-24 MED ORDER — MIDAZOLAM HCL 2 MG/2ML IJ SOLN
INTRAMUSCULAR | Status: AC
  Filled 2011-07-24: qty 2

## 2011-07-24 MED ORDER — METOCLOPRAMIDE HCL 10 MG PO TABS
10.0000 mg | ORAL_TABLET | Freq: Once | ORAL | Status: AC
  Administered 2011-07-24: 10 mg via ORAL
  Filled 2011-07-24: qty 1

## 2011-07-24 MED ORDER — FAMOTIDINE 20 MG PO TABS
40.0000 mg | ORAL_TABLET | Freq: Once | ORAL | Status: AC
  Administered 2011-07-24: 40 mg via ORAL
  Filled 2011-07-24: qty 2

## 2011-07-24 MED ORDER — FENTANYL CITRATE 0.05 MG/ML IJ SOLN
INTRAMUSCULAR | Status: DC | PRN
  Administered 2011-07-24: 100 ug via INTRAVENOUS

## 2011-07-24 MED ORDER — MIDAZOLAM HCL 5 MG/5ML IJ SOLN
INTRAMUSCULAR | Status: DC | PRN
  Administered 2011-07-24: 2 mg via INTRAVENOUS

## 2011-07-24 MED ORDER — FENTANYL CITRATE 0.05 MG/ML IJ SOLN
INTRAMUSCULAR | Status: AC
  Filled 2011-07-24: qty 2

## 2011-07-24 MED ORDER — ONDANSETRON HCL 4 MG/2ML IJ SOLN
INTRAMUSCULAR | Status: DC | PRN
  Administered 2011-07-24: 4 mg via INTRAVENOUS

## 2011-07-24 MED ORDER — IBUPROFEN 600 MG PO TABS: 600.0000 mg | ORAL_TABLET | Freq: Four times a day (QID) | ORAL | Status: DC | PRN

## 2011-07-24 MED ORDER — ONDANSETRON HCL 4 MG/2ML IJ SOLN
INTRAMUSCULAR | Status: AC
  Filled 2011-07-24: qty 2

## 2011-07-24 MED ORDER — BUPIVACAINE HCL (PF) 0.25 % IJ SOLN
INTRAMUSCULAR | Status: DC | PRN
  Administered 2011-07-24: 7 mL

## 2011-07-24 SURGICAL SUPPLY — 21 items
CHLORAPREP W/TINT 26ML (MISCELLANEOUS) ×2 IMPLANT
CONTAINER PREFILL 10% NBF 15ML (MISCELLANEOUS) ×4 IMPLANT
DRAPE UTILITY XL STRL (DRAPES) ×2 IMPLANT
DRSG COVERLET 3X3 (GAUZE/BANDAGES/DRESSINGS) ×1 IMPLANT
GLOVE BIOGEL PI IND STRL 6.5 (GLOVE) ×2 IMPLANT
GLOVE BIOGEL PI INDICATOR 6.5 (GLOVE) ×2
GLOVE SURG SS PI 6.0 STRL IVOR (GLOVE) ×2 IMPLANT
GOWN PREVENTION PLUS LG XLONG (DISPOSABLE) ×4 IMPLANT
NDL HYPO 25X1 1.5 SAFETY (NEEDLE) IMPLANT
NEEDLE HYPO 25X1 1.5 SAFETY (NEEDLE) IMPLANT
NS IRRIG 1000ML POUR BTL (IV SOLUTION) ×2 IMPLANT
PACK ABDOMINAL MINOR (CUSTOM PROCEDURE TRAY) ×2 IMPLANT
SPONGE LAP 4X18 X RAY DECT (DISPOSABLE) IMPLANT
SUT PLAIN 0 NONE (SUTURE) ×2 IMPLANT
SUT VIC AB 0 CT1 27 (SUTURE) ×2
SUT VIC AB 0 CT1 27XBRD ANBCTR (SUTURE) ×1 IMPLANT
SUT VIC AB 3-0 PS2 18 (SUTURE) ×2 IMPLANT
SYR CONTROL 10ML LL (SYRINGE) IMPLANT
TOWEL OR 17X24 6PK STRL BLUE (TOWEL DISPOSABLE) ×4 IMPLANT
TRAY FOLEY CATH 14FR (SET/KITS/TRAYS/PACK) ×2 IMPLANT
WATER STERILE IRR 1000ML POUR (IV SOLUTION) ×1 IMPLANT

## 2011-07-24 NOTE — Anesthesia Postprocedure Evaluation (Signed)
  Anesthesia Post-op Note  Patient: Kathryn Golden  Procedure(s) Performed:  POST PARTUM TUBAL LIGATION  Patient Location: PACU  Anesthesia Type: Spinal  Level of Consciousness: awake, alert  and oriented  Airway and Oxygen Therapy: Patient Spontanous Breathing  Post-op Pain: none  Post-op Assessment: Post-op Vital signs reviewed, Patient's Cardiovascular Status Stable, Respiratory Function Stable, Patent Airway, No signs of Nausea or vomiting, Pain level controlled, No headache, No backache, No residual numbness and No residual motor weakness  Post-op Vital Signs: Reviewed and stable  Complications: No apparent anesthesia complications

## 2011-07-24 NOTE — Progress Notes (Signed)
Phone call to Dr Jolayne Panther [id number].  Order to d/c post-op.

## 2011-07-24 NOTE — Transfer of Care (Signed)
Immediate Anesthesia Transfer of Care Note  Patient: Kathryn Golden  Procedure(s) Performed:  POST PARTUM TUBAL LIGATION  Patient Location: PACU  Anesthesia Type: Spinal  Level of Consciousness: awake, alert , oriented and patient cooperative  Airway & Oxygen Therapy: Patient Spontanous Breathing  Post-op Assessment: Report given to PACU RN and Post -op Vital signs reviewed and stable  Post vital signs: Reviewed and stable  Complications: No apparent anesthesia complications

## 2011-07-24 NOTE — Progress Notes (Signed)
Notified Darlene Lawson,CNM regarding critical lab value of K 2.5. Order placed by Zerita Boers, CNM for K-Dur.

## 2011-07-24 NOTE — Op Note (Signed)
NAME@ 07/24/2011  PREOPERATIVE DIAGNOSIS:  Undesired fertility  POSTOPERATIVE DIAGNOSIS:  Undesired fertility  PROCEDURE:  Postpartum Bilateral Tubal Sterilization using Pomeroy method   SURGEON: Dr. Catalina Antigua, MD  ANESTHESIA:  Spinalal  COMPLICATIONS:  None immediate.  ESTIMATED BLOOD LOSS:  Less than 20cc.  FLUIDS: 1000 cc LR.  URINE OUTPUT:  100 cc of clear urine.  INDICATIONS: 35 y.o. yo [id number]  with undesired fertility,status post vaginal delivery, desires permanent sterilization. Risks and benefits of procedure discussed with patient including permanence of method, bleeding, infection, injury to surrounding organs and need for additional procedures. Risk failure of 0.5-1% with increased risk of ectopic gestation if pregnancy occurs was also discussed with patient.   FINDINGS:  Normal uterus, tubes, and ovaries.  TECHNIQUE: After informed consent was obtained, the patient was taken to the operating room where anesthesia was induced and found to be adequate. A small transverse, infraumbilical skin incision was made with the scalpel. This incision was carried down to the underlying layer of fascia. The fascia was grasped with Kocher clamps tented up and entered sharply with Mayo scissors. Underlying peritoneum was then identified tented up and entered sharply with Metzenbaum scissors. The fascia was tagged with 0 Vicryl. The patient's left fallopian tube was then identified, brought to the incision, and grasped with a Babcock clamp. The tube was then followed out to the fimbria. The Babcock clamp was then used to grasp the tube approximately 4 cm from the cornual region. A 3 cm segment of the tube was then ligated with free tie of plain gut suture, transected and excised. Good hemostasis was noted and the tube was returned to the abdomen. The right fallopian tube was then identified to its fimbriated end, ligated, and a 3 cm segment excised in a similar fashion. Excellent hemostasis  was noted, and the tube returned to the abdomen. The fascia was re-approximated with 0 Vicryl. The skin was closed in a subcuticular fashion with 3-0 Vicryl. Quarter percent Marcaine solution was then injected at the incision site. The patient tolerated the procedure well. Sponge, lap, and needle count were correct x2. The patient was taken to recovery room in stable condition.

## 2011-07-24 NOTE — Anesthesia Preprocedure Evaluation (Addendum)
Anesthesia Evaluation  Name, MR# and DOB Patient awake  General Assessment Comment  Reviewed: Allergy & Precautions, H&P , NPO status , Patient's Chart, lab work & pertinent test results  History of Anesthesia Complications (+) PONV  Airway Mallampati: II TM Distance: >3 FB Neck ROM: Full    Dental No notable dental hx. (+) Teeth Intact   Pulmonary  clear to auscultation  Pulmonary exam normal       Cardiovascular Regular Normal    Neuro/Psych Negative Neurological ROS  Negative Psych ROS   GI/Hepatic negative GI ROS Neg liver ROS  GERD Medicated and Controlled  Endo/Other  Negative Endocrine ROS  Renal/GU negative Renal ROS  Genitourinary negative   Musculoskeletal   Abdominal   Peds  Hematology negative hematology ROS (+)   Anesthesia Other Findings   Reproductive/Obstetrics negative OB ROS                           Anesthesia Physical Anesthesia Plan  ASA: II  Anesthesia Plan: Spinal   Post-op Pain Management:    Induction:   Airway Management Planned:   Additional Equipment:   Intra-op Plan:   Post-operative Plan:   Informed Consent: I have reviewed the patients History and Physical, chart, labs and discussed the procedure including the risks, benefits and alternatives for the proposed anesthesia with the patient or authorized representative who has indicated his/her understanding and acceptance.     Plan Discussed with: Anesthesiologist and CRNA  Anesthesia Plan Comments:        Anesthesia Quick Evaluation

## 2011-07-24 NOTE — Progress Notes (Signed)
Post Partum Day 1  Subjective: no complaints, up ad lib, voiding and tolerating PO  Objective: Blood pressure 136/75, pulse 77, temperature 97.8 F (36.6 C), temperature source Oral, resp. rate 16, SpO2 97.00%, unknown if currently breastfeeding.  Physical Exam:  General: alert and cooperative Lochia: appropriate Uterine Fundus: firm Incision: n/a DVT Evaluation: No evidence of DVT seen on physical exam. Negative Homan's sign.   Basename 07/22/11 2350  HGB 11.4*  HCT 34.4*    Assessment/Plan: Plan for discharge tomorrow, Breastfeeding, Lactation consult and Contraception BTL at 3 p, today RN to call [facility] at 8 am to have BTL papers faxed down.   Pt consented for BTL.  Pt understands a BTL is meant to be permanent and not reversed.  There is increased risk of ectopic after BTL, and pt should seek care immediately if she becomes pregnant.   LOS: 1 day   LEGGETT,KELLY H. 07/24/2011, 6:51 AM

## 2011-07-24 NOTE — Progress Notes (Signed)
UR chart review completed.  

## 2011-07-24 NOTE — Anesthesia Procedure Notes (Signed)
Spinal Block  Patient location during procedure: OR Start time: 07/24/2011 3:27 PM Staffing Anesthesiologist: Kashvi Prevette A. Performed by: anesthesiologist  Preanesthetic Checklist Completed: patient identified, site marked, surgical consent, pre-op evaluation, timeout performed, IV checked, risks and benefits discussed and monitors and equipment checked Spinal Block Patient position: sitting Prep: site prepped and draped and DuraPrep Patient monitoring: heart rate, cardiac monitor, continuous pulse ox and blood pressure Approach: midline Location: L3-4 Injection technique: single-shot Needle Needle type: Sprotte  Needle gauge: 24 G Needle length: 9 cm Needle insertion depth: 4 cm Assessment Sensory level: T4 Additional Notes Patient tolerated procedure well. Adequate surgical anesthetic level.

## 2011-07-25 ENCOUNTER — Encounter (HOSPITAL_COMMUNITY): Payer: Self-pay | Admitting: Obstetrics and Gynecology

## 2011-07-25 MED ORDER — LANOLIN HYDROUS EX OINT: 1.0000 "application " | TOPICAL_OINTMENT | CUTANEOUS | Status: DC | PRN

## 2011-07-25 MED ORDER — BENZOCAINE-MENTHOL 20-0.5 % EX AERO: 1.0000 "application " | INHALATION_SPRAY | CUTANEOUS | Status: DC | PRN

## 2011-07-25 MED ORDER — IBUPROFEN 600 MG PO TABS: 600.0000 mg | ORAL_TABLET | Freq: Four times a day (QID) | ORAL | Status: AC

## 2011-07-25 NOTE — Discharge Summary (Signed)
Agree with above note.  Mackson Botz 07/25/2011 8:00 AM   

## 2011-07-25 NOTE — Anesthesia Postprocedure Evaluation (Signed)
  Anesthesia Post-op Note  Patient: Kathryn Golden  Procedure(s) Performed:  POST PARTUM TUBAL LIGATION  Patient Location: PACU and Mother/Baby  Anesthesia Type: Spinal  Level of Consciousness: awake, alert  and oriented  Airway and Oxygen Therapy: Patient Spontanous Breathing  Post-op Pain: none  Post-op Assessment: Post-op Vital signs reviewed and Patient's Cardiovascular Status Stable  Post-op Vital Signs: Reviewed and stable  Complications: No apparent anesthesia complications

## 2011-07-25 NOTE — Progress Notes (Signed)
Encounter addended by: Madison Hickman on: 07/25/2011  8:15 AM<BR>     Documentation filed: Notes Section

## 2011-07-25 NOTE — Discharge Summary (Signed)
Obstetric Discharge Summary Reason for Admission: onset of labor Prenatal Procedures: NST and ultrasound Intrapartum Procedures: spontaneous vaginal delivery Postpartum Procedures: P.P. tubal ligation Complications-Operative and Postpartum: none Hemoglobin  Date Value Range Status  07/22/2011 11.4* 12.0-15.0 (g/dL) Final     HCT  Date Value Range Status  07/22/2011 34.4* 36.0-46.0 (%) Final    Discharge Diagnoses: Term Pregnancy-delivered and POD#2 BTS  Discharge Information: Date: 07/25/2011 Activity: pelvic rest Diet: routine Medications: Ibuprofen Condition: stable Instructions: refer to practice specific booklet Discharge to: home Follow-up Information    Follow up with [facility]. Call in 4 weeks.   Contact information:   [address] [place] [address]          Newborn Data: Live born female  Birth Weight: 7 lb 3.9 oz (3285 g) APGAR: 9, 9  Home with mother.  Valene Villa E. 07/25/2011, 7:11 AM

## 2011-07-25 NOTE — Progress Notes (Signed)
Post Partum Day 2 Subjective: no complaints  Objective: Blood pressure 123/66, pulse 80, temperature 97.9 F (36.6 C), temperature source Oral, resp. rate 16, SpO2 96.00%, unknown if currently breastfeeding.  Physical Exam:  General: alert, cooperative and no distress Lochia: appropriate Uterine Fundus: firm Incision: healing well, BTL drsg C/D/I DVT Evaluation: No evidence of DVT seen on physical exam. No cords or calf tenderness.   Basename 07/22/11 2350  HGB 11.4*  HCT 34.4*    Assessment/Plan: Discharge home, Breastfeeding and Contraception BTS   LOS: 2 days   Anajah Sterbenz E. 07/25/2011, 7:08 AM

## 2011-07-25 NOTE — H&P (Signed)
Agree with above note.  Kathryn Golden H. 07/25/2011 5:09 PM

## 2011-07-30 NOTE — ED Provider Notes (Signed)
Reviewed note by resident and agree with plan of care. [facility]

## 2012-03-05 ENCOUNTER — Other Ambulatory Visit: Payer: Self-pay | Admitting: Neurology

## 2012-03-05 ENCOUNTER — Ambulatory Visit (HOSPITAL_COMMUNITY)
Admission: RE | Admit: 2012-03-05 | Discharge: 2012-03-05 | Disposition: A | Discharge status: Home / Self Care | Payer: Medicaid Other | Source: Ambulatory Visit | Attending: Neurology | Admitting: Neurology

## 2012-03-05 DIAGNOSIS — R52 Pain, unspecified: Secondary | ICD-10-CM

## 2012-03-05 DIAGNOSIS — R209 Unspecified disturbances of skin sensation: Secondary | ICD-10-CM | POA: Insufficient documentation

## 2012-03-05 DIAGNOSIS — M542 Cervicalgia: Secondary | ICD-10-CM | POA: Insufficient documentation

## 2013-03-24 ENCOUNTER — Other Ambulatory Visit (HOSPITAL_COMMUNITY): Payer: Self-pay | Admitting: Family Medicine

## 2013-03-24 DIAGNOSIS — R1013 Epigastric pain: Secondary | ICD-10-CM

## 2013-03-24 DIAGNOSIS — R110 Nausea: Secondary | ICD-10-CM

## 2013-03-24 DIAGNOSIS — R52 Pain, unspecified: Secondary | ICD-10-CM

## 2013-03-25 ENCOUNTER — Ambulatory Visit (HOSPITAL_COMMUNITY)
Admission: RE | Admit: 2013-03-25 | Discharge: 2013-03-25 | Disposition: A | Discharge status: Home / Self Care | Payer: Medicaid Other | Source: Ambulatory Visit | Attending: Family Medicine | Admitting: Family Medicine

## 2013-03-25 DIAGNOSIS — R1013 Epigastric pain: Secondary | ICD-10-CM

## 2013-03-25 DIAGNOSIS — R109 Unspecified abdominal pain: Secondary | ICD-10-CM | POA: Insufficient documentation

## 2013-03-25 DIAGNOSIS — R52 Pain, unspecified: Secondary | ICD-10-CM

## 2013-03-25 DIAGNOSIS — R110 Nausea: Secondary | ICD-10-CM

## 2013-03-26 ENCOUNTER — Encounter: Payer: Self-pay | Admitting: *Deleted

## 2013-03-27 ENCOUNTER — Encounter: Payer: Self-pay | Admitting: Adult Health

## 2013-03-27 ENCOUNTER — Ambulatory Visit (INDEPENDENT_AMBULATORY_CARE_PROVIDER_SITE_OTHER): Payer: Medicaid Other | Admitting: Adult Health

## 2013-03-27 VITALS — BP 122/80 | Ht 67.0 in | Wt 159.0 lb

## 2013-03-27 DIAGNOSIS — N63 Unspecified lump in unspecified breast: Secondary | ICD-10-CM | POA: Insufficient documentation

## 2013-03-27 DIAGNOSIS — M797 Fibromyalgia: Secondary | ICD-10-CM

## 2013-03-27 DIAGNOSIS — F411 Generalized anxiety disorder: Secondary | ICD-10-CM

## 2013-03-27 DIAGNOSIS — F419 Anxiety disorder, unspecified: Secondary | ICD-10-CM

## 2013-03-27 DIAGNOSIS — IMO0001 Reserved for inherently not codable concepts without codable children: Secondary | ICD-10-CM

## 2013-03-27 HISTORY — DX: Anxiety disorder, unspecified: F41.9

## 2013-03-27 HISTORY — DX: Unspecified lump in unspecified breast: N63.0

## 2013-03-27 HISTORY — DX: Fibromyalgia: M79.7

## 2013-03-27 NOTE — Progress Notes (Signed)
Subjective:     Patient ID: Kathryn Golden, female   DOB: [birth year], 37 y.o.   MRN: [id number]  HPI Kathryn Golden is a 37 year old white female in today complaining of pain left breast x 2 months and she had had a rash ??shingles, but the rash is gone.  Review of Systems Positives in HPI  Reviewed past medical,surgical, social and family history. Reviewed medications and allergies.     Objective:   Physical Exam BP 122/80  Ht 5\' 7"  (1.702 m)  Wt 159 lb (72.122 kg)  BMI 24.9 kg/m2  LMP 03/24/2013  Breastfeeding? NoSkin warm and dry   Breast: right: no dominant mass, retraction or nipple discharge, left: there is a pea sized lump at 2 o'clock 4 finger breaths from areola, it's tender and a faint redness above it like where rash may have been, no retraction or nipple discharge. Assessment:      Left breast lump History of fibromyalgia and anxiety    Plan:    Diagnostic bilateral mammogram and left breast US 6/18 at 9:15 at [facility]   Call or return prn

## 2013-03-27 NOTE — Patient Instructions (Addendum)
Mammogram 6/18 at 9:15 at [facility]

## 2013-04-09 ENCOUNTER — Ambulatory Visit (HOSPITAL_COMMUNITY)
Admission: RE | Admit: 2013-04-09 | Discharge: 2013-04-09 | Disposition: A | Discharge status: Home / Self Care | Payer: Medicaid Other | Source: Ambulatory Visit | Attending: Adult Health | Admitting: Adult Health

## 2013-04-09 ENCOUNTER — Other Ambulatory Visit: Payer: Self-pay | Admitting: Adult Health

## 2013-04-09 DIAGNOSIS — N63 Unspecified lump in unspecified breast: Secondary | ICD-10-CM

## 2014-04-18 ENCOUNTER — Other Ambulatory Visit: Payer: Self-pay | Admitting: Obstetrics and Gynecology

## 2014-04-26 NOTE — Telephone Encounter (Signed)
1 yr supply used 2x/wk

## 2014-05-20 ENCOUNTER — Ambulatory Visit (INDEPENDENT_AMBULATORY_CARE_PROVIDER_SITE_OTHER): Payer: Medicaid Other | Admitting: Advanced Practice Midwife

## 2014-05-20 ENCOUNTER — Encounter: Payer: Self-pay | Admitting: Advanced Practice Midwife

## 2014-05-20 VITALS — BP 110/80 | Ht 68.0 in | Wt 134.0 lb

## 2014-05-20 DIAGNOSIS — N898 Other specified noninflammatory disorders of vagina: Secondary | ICD-10-CM

## 2014-05-20 MED ORDER — FLUCONAZOLE 150 MG PO TABS: ORAL_TABLET | ORAL | Status: DC

## 2014-05-20 MED ORDER — METRONIDAZOLE 0.75 % VA GEL: Freq: Every day | VAGINAL | Status: AC

## 2014-05-20 NOTE — Progress Notes (Signed)
[facility] Visit  Patient name: Kathryn Golden MRN [id number]  Date of birth: [birth year]  CC & HPI:  Kathryn Golden is a 38 y.o. Caucasian female presenting today for c/o vaginal irritaion with fishy odor for 5 days.  Used one dose of metronidazole 2 days ago. Also c/o "bump" on vulva a few days ago.  Hx of HPV  Pertinent History Reviewed:  Medical & Surgical Hx:   Past Medical History  Diagnosis Date  . PIH (pregnancy induced hypertension)   . Endometriosis   . HPV (human papilloma virus) infection   . ADHD (attention deficit hyperactivity disorder)   . Depression   . Abnormal Pap smear   . Fibromyalgia 03/27/2013  . Anxiety 03/27/2013  . Breast lump 03/27/2013  . Migraine    Past Surgical History  Procedure Laterality Date  . Dilation and curretage    . Dilation and curettage of uterus    . Hand surgery    . Tubal ligation  07/24/2011    Procedure: POST PARTUM TUBAL LIGATION;  Surgeon: Catalina AntiguaPeggy Constant, MD;  Location: [facility];  Service: Gynecology;  Laterality: Bilateral;   Medications: Reviewed & Updated - see associated section Social History: Reviewed -  reports that she has been smoking Cigarettes.  She has a 20 pack-year smoking history. She has never used smokeless tobacco.  Objective Findings:  Vitals: BP 110/80  Ht 5\' 8"  (1.727 m)  Wt 134 lb (60.782 kg)  BMI 20.38 kg/m2  LMP 05/10/2014  Physical Examination: General appearance - alert, well appearing, and in no distress Mental status - alert, oriented to person, place, and time Pelvic - Has many small furuncles on vulva, looks like from shaving.  Has one pea sized area on right labia that COULD represent a healing HSV lesion, but pt doesn't think it has changed any. No skin changes when acetic acid applied SSE:  Copious amount of metronidazole.  Wet prep has a few clues and yeast/  No results found for this or any previous visit (from the past 24 hour(s)).   Assessment & Plan:  A:   Probable BV, maybe some  yeast P:  Shaving hygiene discussed    Orders Placed This Encounter  Procedures  . HSV 2 antibody, IgG   Finish course of flagyl Diflucan  F/U prn  CRESENZO-DISHMAN,Vondra Aldredge CNM 05/20/2014 9:21 AM

## 2014-05-21 LAB — HSV 2 ANTIBODY, IGG: HSV 2 Glycoprotein G Ab, IgG: <0.1 IV

## 2014-05-22 ENCOUNTER — Encounter: Payer: Self-pay | Admitting: Advanced Practice Midwife

## 2014-07-28 ENCOUNTER — Telehealth: Payer: Self-pay | Admitting: *Deleted

## 2014-07-28 ENCOUNTER — Telehealth: Payer: Self-pay | Admitting: Obstetrics & Gynecology

## 2014-07-28 NOTE — Telephone Encounter (Signed)
Pt c/o abdominal, back,and leg pain, abnormal vaginal bleeding. Call transferred to front staff for an appt to be made.

## 2014-07-29 ENCOUNTER — Ambulatory Visit (INDEPENDENT_AMBULATORY_CARE_PROVIDER_SITE_OTHER): Payer: Medicaid Other | Admitting: Advanced Practice Midwife

## 2014-07-29 ENCOUNTER — Encounter: Payer: Self-pay | Admitting: Advanced Practice Midwife

## 2014-07-29 VITALS — BP 110/80 | Wt 134.4 lb

## 2014-07-29 DIAGNOSIS — N920 Excessive and frequent menstruation with regular cycle: Secondary | ICD-10-CM

## 2014-07-29 DIAGNOSIS — Z3202 Encounter for pregnancy test, result negative: Secondary | ICD-10-CM

## 2014-07-29 LAB — POCT URINE PREGNANCY: Preg Test, Ur: NEGATIVE

## 2014-07-29 NOTE — Progress Notes (Signed)
[facility] Visit  Patient name: Kathryn Golden MRN [id number]  Date of birth: Jan 27, 1976  CC & HPI:  Kathryn PunaKristy N Golden is a 38 y.o. Caucasian female presenting today for c/o pain across lower part of abdomen, abnormal vaginal bleeding this month only.  States menstrual cycle has become much heavier with clots, and cramping is worse. Started period 10/1 and it is now slowing down.  Concerned because the pain was worse with this period.  Has endometriosis.  Concerned that she could be having a SAB.  Bleeding has drastically slowed down over the past 2 days.  Pertinent History Reviewed:  Medical & Surgical Hx:   Past Medical History  Diagnosis Date  . PIH (pregnancy induced hypertension)   . Endometriosis   . HPV (human papilloma virus) infection   . ADHD (attention deficit hyperactivity disorder)   . Depression   . Abnormal Pap smear   . Fibromyalgia 03/27/2013  . Anxiety 03/27/2013  . Breast lump 03/27/2013  . Migraine    Past Surgical History  Procedure Laterality Date  . Dilation and curretage    . Dilation and curettage of uterus    . Hand surgery    . Tubal ligation  07/24/2011    Procedure: POST PARTUM TUBAL LIGATION;  Surgeon: Catalina AntiguaPeggy Constant, MD;  Location: [facility];  Service: Gynecology;  Laterality: Bilateral;   Medications: Reviewed & Updated - see associated section Social History: Reviewed -  reports that she has been smoking Cigarettes.  She has a 20 pack-year smoking history. She has never used smokeless tobacco.  Objective Findings:  Vitals: BP 110/80  Wt 134 lb 6.4 oz (60.963 kg)  LMP 07/23/2014 UPT and UA negative  Physical Examination: General appearance - alert, well appearing, and in no distress Mental status - alert, oriented to person, place, and time Pelvic - scant amount of menstrual blood  No results found for this or any previous visit (from the past 24 hour(s)).   Assessment & Plan:  A:   Menorrhagia X 1 month P:  Options discussed if periods  continue to be heavy:  Birth control, lysteda, endo ablation. Or tx specific for endometriosis. Will see what next month is like   CRESENZO-DISHMAN,Kathryn Golden CNM 07/29/2014 4:11 PM

## 2014-07-29 NOTE — Addendum Note (Signed)
Addended by: Criss AlvinePULLIAM, CHRYSTAL G on: 07/29/2014 04:17 PM   Modules accepted: Orders

## 2014-07-30 ENCOUNTER — Telehealth: Payer: Self-pay | Admitting: *Deleted

## 2014-07-30 MED ORDER — NORETHIN-ETH ESTRAD-FE BIPHAS 1 MG-10 MCG / 10 MCG PO TABS: 1.0000 | ORAL_TABLET | Freq: Every day | ORAL | Status: DC

## 2014-07-30 NOTE — Telephone Encounter (Signed)
Left message x 1. JSY 

## 2014-07-30 NOTE — Telephone Encounter (Signed)
loloestrin sent to pharmacy  Start it today, may double up for a few days to make bleeding stop sooner).

## 2014-07-30 NOTE — Telephone Encounter (Signed)
Spoke with pt letting her know Lo Loestrin was sent to pharmacy. Start med today. Advised  can double up for a few days to make bleeding stop sooner. Can have break through bleeding for the 1st 3 packs after starting a new birth control pill. Pt voiced understanding. JSY

## 2014-07-30 NOTE — Telephone Encounter (Signed)
Spoke with pt. Pt saw Drenda FreezeFran yesterday. Pt states she has endometriosis. She thought her bleeding was going to stop, but she is still bleeding. She discussed birth control at yesterday's visit. She would like to try Lo Loestrin Fe pills. Has Medicaid. Please advise. Pt would like you to call her back if possible. Thanks!! JSY

## 2014-08-05 ENCOUNTER — Ambulatory Visit (INDEPENDENT_AMBULATORY_CARE_PROVIDER_SITE_OTHER): Payer: Medicaid Other | Admitting: Women's Health

## 2014-08-05 ENCOUNTER — Encounter: Payer: Self-pay | Admitting: Women's Health

## 2014-08-05 VITALS — BP 120/60 | Ht 67.0 in | Wt 133.0 lb

## 2014-08-05 DIAGNOSIS — R102 Pelvic and perineal pain unspecified side: Secondary | ICD-10-CM | POA: Insufficient documentation

## 2014-08-05 DIAGNOSIS — N809 Endometriosis, unspecified: Secondary | ICD-10-CM | POA: Insufficient documentation

## 2014-08-05 DIAGNOSIS — N921 Excessive and frequent menstruation with irregular cycle: Secondary | ICD-10-CM

## 2014-08-05 LAB — POCT WET PREP (WET MOUNT): Clue Cells Wet Prep Whiff POC: POSITIVE

## 2014-08-05 MED ORDER — METRONIDAZOLE 500 MG PO TABS: 500.0000 mg | ORAL_TABLET | Freq: Two times a day (BID) | ORAL | Status: DC

## 2014-08-05 NOTE — Progress Notes (Signed)
Patient ID: Kathryn Golden, female   DOB: [birth year], 38 y.o.   MRN: [id number]   [facility]  Patient name: Kathryn Golden MRN [id number]  Date of birth: [birth year]  CC & HPI:  Kathryn Golden is a 38 y.o. Caucasian female presenting today for report of pelvic pain/cramping/aching and vb despite starting LoLestrin 6 days ago and doubling up x 4 days- states there has been no change in bleeding. Wears pads, changes 3-4x/day, some clots. Denies abnormal d/c, itching/odor/irritation. 'Knows something is not right'. Has h/o endometriosis that was dx on pathology report from previous D&C. She is s/p BTL. Due for pap. Not sure of last STI screening. Currently sexually active.   Pertinent History Reviewed:  Medical & Surgical Hx:   Past Medical History  Diagnosis Date  . PIH (pregnancy induced hypertension)   . Endometriosis   . HPV (human papilloma virus) infection   . ADHD (attention deficit hyperactivity disorder)   . Depression   . Abnormal Pap smear   . Fibromyalgia 03/27/2013  . Anxiety 03/27/2013  . Breast lump 03/27/2013  . Migraine    Past Surgical History  Procedure Laterality Date  . Dilation and curretage    . Dilation and curettage of uterus    . Hand surgery    . Tubal ligation  07/24/2011    Procedure: POST PARTUM TUBAL LIGATION;  Surgeon: Catalina AntiguaPeggy Constant, MD;  Location: [facility];  Service: Gynecology;  Laterality: Bilateral;   Medications: Reviewed & Updated - see associated section Social History: Reviewed -  reports that she has been smoking Cigarettes.  She has a 20 pack-year smoking history. She has never used smokeless tobacco.  Objective Findings:  Vitals: BP 120/60  Ht 5\' 7"  (1.702 m)  Wt 133 lb (60.328 kg)  BMI 20.83 kg/m2  LMP 07/23/2014  Physical Examination: General appearance - alert, well appearing, and in no distress Pelvic - normal external genitalia, vulva, vagina, cervix, uterus and adnexa Small amount slightly malodorous menstrual blood in  vault, nothing currently coming from os  No results found for this or any previous visit (from the past 24 hour(s)).   Assessment & Plan:  A:   Pelvic pain  Menorrhagia  BV  Endometriosis P:  Rx flagyl for bv, no etoh  Will send urine for gc/ct  Continue LoLoestrin as rx'd   F/U asap for pelvic u/s and f/u w/ MD to discuss further options  Will also need pap sometime soon in future  Marge DuncansBooker, Jenicka Coxe Randall CNM, [facility]NP-BC 08/05/2014 12:35 PM

## 2014-08-06 ENCOUNTER — Encounter: Payer: Self-pay | Admitting: Obstetrics & Gynecology

## 2014-08-06 ENCOUNTER — Other Ambulatory Visit: Payer: Self-pay | Admitting: Women's Health

## 2014-08-06 ENCOUNTER — Ambulatory Visit (INDEPENDENT_AMBULATORY_CARE_PROVIDER_SITE_OTHER): Payer: Medicaid Other

## 2014-08-06 ENCOUNTER — Ambulatory Visit (INDEPENDENT_AMBULATORY_CARE_PROVIDER_SITE_OTHER): Payer: Medicaid Other | Admitting: Obstetrics & Gynecology

## 2014-08-06 VITALS — BP 120/60 | Wt 134.0 lb

## 2014-08-06 DIAGNOSIS — R102 Pelvic and perineal pain: Secondary | ICD-10-CM

## 2014-08-06 DIAGNOSIS — N730 Acute parametritis and pelvic cellulitis: Secondary | ICD-10-CM

## 2014-08-06 DIAGNOSIS — N939 Abnormal uterine and vaginal bleeding, unspecified: Secondary | ICD-10-CM

## 2014-08-06 DIAGNOSIS — N921 Excessive and frequent menstruation with irregular cycle: Secondary | ICD-10-CM

## 2014-08-06 LAB — CBC
HCT: 28 % — ABNORMAL LOW (ref 36.0–46.0)
HEMOGLOBIN: 8.6 g/dL — AB (ref 12.0–15.0)
MCH: 23.2 pg — ABNORMAL LOW (ref 26.0–34.0)
MCHC: 30.7 g/dL (ref 30.0–36.0)
MCV: 75.5 fL — ABNORMAL LOW (ref 78.0–100.0)
Platelets: 301 10*3/uL (ref 150–400)
RBC: 3.71 MIL/uL — AB (ref 3.87–5.11)
RDW: 14.7 % (ref 11.5–15.5)
WBC: 5.5 10*3/uL (ref 4.0–10.5)

## 2014-08-06 LAB — SEDIMENTATION RATE: SED RATE: 60 mm/h — AB (ref 0–22)

## 2014-08-06 LAB — GC/CHLAMYDIA PROBE AMP
CT PROBE, AMP APTIMA: POSITIVE — AB
GC PROBE AMP APTIMA: NEGATIVE

## 2014-08-06 MED ORDER — CIPROFLOXACIN HCL 500 MG PO TABS: 500.0000 mg | ORAL_TABLET | Freq: Two times a day (BID) | ORAL | Status: DC

## 2014-08-06 MED ORDER — MEGESTROL ACETATE 40 MG PO TABS: ORAL_TABLET | ORAL | Status: DC

## 2014-08-06 NOTE — Progress Notes (Signed)
Patient ID: Kathryn Golden, female   DOB: Mar 22, 1976, 38 y.o.   MRN: [id number] US Transvaginal Non-ob  08/06/2014   GYNECOLOGIC SONOGRAM   LATOYA MAULDING is a 38 y.o. [id number] LMP 07/23/2014 for a pelvic sonogram for  menorrhagia and pelvic pain.  Uterus                      8.1 x 5.8 x 4.4 cm, retroverted , posterior  "streaky" shadows noted although no obvious myometrial mass noted within  the uterus  Endometrium          7.8 mm, symmetrical   Right ovary             3.6 x 1.7 x 1.6 cm,   Left ovary                3.1 x 2.0 x 1.7 cm,   No free fluid or adnexal masses noted within the pelvis   Technician Comments:  Retroverted uterus with posterior "streaky" shadows noted (?adenomyosis),  Endom-7.43m symmetrical, bilateral adnexa/ovaries appears WNL, no free  fluid or adnexal masses noted within the pelvis   MLazarus Gowda10/15/2015 2:25 PM   Clinical Impression and recommendations:  I have reviewed the sonogram results above, combined with the patient's  current clinical course, below are my impressions and any appropriate  recommendations for management based on the sonographic findings.  Normal gynecologic sonogram, suggestion of adenomyosis   EURE,LUTHER H 08/06/2014 3:21 PM    Chlamydia positive:  Because of her ESR and symptoms will treat with cipro 500 x 10 days and do follow up test in 3 weeks

## 2014-08-07 ENCOUNTER — Other Ambulatory Visit: Payer: Self-pay

## 2014-08-24 ENCOUNTER — Encounter: Payer: Self-pay | Admitting: Obstetrics & Gynecology

## 2014-08-27 ENCOUNTER — Ambulatory Visit (INDEPENDENT_AMBULATORY_CARE_PROVIDER_SITE_OTHER): Payer: Medicaid Other | Admitting: Obstetrics & Gynecology

## 2014-08-27 ENCOUNTER — Encounter: Payer: Self-pay | Admitting: Obstetrics & Gynecology

## 2014-08-27 VITALS — BP 110/70 | Wt 133.0 lb

## 2014-08-27 DIAGNOSIS — A749 Chlamydial infection, unspecified: Secondary | ICD-10-CM

## 2014-08-27 DIAGNOSIS — Z01818 Encounter for other preprocedural examination: Secondary | ICD-10-CM

## 2014-08-27 DIAGNOSIS — N946 Dysmenorrhea, unspecified: Secondary | ICD-10-CM

## 2014-08-27 DIAGNOSIS — F526 Dyspareunia not due to a substance or known physiological condition: Secondary | ICD-10-CM

## 2014-08-27 DIAGNOSIS — N920 Excessive and frequent menstruation with regular cycle: Secondary | ICD-10-CM

## 2014-08-27 NOTE — Addendum Note (Signed)
Addended by: Criss AlvinePULLIAM, CHRYSTAL G on: 08/27/2014 12:13 PM   Modules accepted: Orders

## 2014-08-27 NOTE — Progress Notes (Signed)
Patient ID: Kathryn Golden, female   DOB: [birth year], 38 y.o.   MRN: [id number] TOC for chlamydia  Pt states she feel better  Has long history of dysmenorrhea and dysparunia, being managed on megace Has adenomyosis  [facility] the [facility] Will plan to proceed with making sure her chlamydia culture is negative  Preoperative History and Physical  Kathryn Golden is a 38 y.o. [phone] with Patient's last menstrual period was 07/23/2014. admitted for a TVH for long standing menorrhagia dysmenorrhea and dyspareunia.  Managed with megestrol, wants to proceed with definitive therapy  PMH:    Past Medical History  Diagnosis Date  . PIH (pregnancy induced hypertension)   . Endometriosis   . HPV (human papilloma virus) infection   . ADHD (attention deficit hyperactivity disorder)   . Depression   . Abnormal Pap smear   . Fibromyalgia 03/27/2013  . Anxiety 03/27/2013  . Breast lump 03/27/2013  . Migraine     PSH:     Past Surgical History  Procedure Laterality Date  . Dilation and curretage    . Dilation and curettage of uterus    . Hand surgery    . Tubal ligation  07/24/2011    Procedure: POST PARTUM TUBAL LIGATION;  Surgeon: Catalina AntiguaPeggy Constant, MD;  Location: [facility];  Service: Gynecology;  Laterality: Bilateral;    POb/GynH:      OB History    Gravida Para Term Preterm AB TAB SAB Ectopic Multiple Living   5 3 3  0 2 0 2 0 0 3      SH:   History  Substance Use Topics  . Smoking status: Current Every Day Smoker -- 1.00 packs/day for 20 years    Types: Cigarettes  . Smokeless tobacco: Never Used  . Alcohol Use: Yes     Comment: occassional    FH:    Family History  Problem Relation Age of Onset  . Cancer Father     kidney   . Hypertension Father   . Heart disease Father   . Hyperlipidemia Father   . Other Brother     Factor 13 def.  . Clotting disorder Brother   . Hypertension Other   . Hyperlipidemia Paternal Grandfather   . Heart  disease Paternal Grandfather      Allergies: No Known Allergies  Medications:      Current outpatient prescriptions: aspirin-acetaminophen-caffeine (EXCEDRIN MIGRAINE) 250-250-65 MG per tablet, Take by mouth every 6 (six) hours as needed for headache., Disp: , Rfl: ;  clonazePAM (KLONOPIN) 1 MG tablet, Take 1 mg by mouth 2 (two) times daily., Disp: , Rfl: ;  HYDROcodone-acetaminophen (NORCO) 10-325 MG per tablet, Take 1 tablet by mouth every 6 (six) hours as needed for pain., Disp: , Rfl:  megestrol (MEGACE) 40 MG tablet, 3 a day for 5 days, 2 a day for 5 days then 1 a day, Disp: 45 tablet, Rfl: 1;  methocarbamol (ROBAXIN) 500 MG tablet, Take 500 mg by mouth 4 (four) times daily., Disp: , Rfl: ;  pregabalin (LYRICA) 100 MG capsule, Take 100 mg by mouth at bedtime and may repeat dose one time if needed., Disp: , Rfl: ;  topiramate (TOPAMAX) 50 MG tablet, Take 50 mg by mouth 2 (two) times daily., Disp: , Rfl:  ciprofloxacin (CIPRO) 500 MG tablet, Take 1 tablet (500 mg total) by mouth 2 (two) times daily., Disp: 20 tablet, Rfl: 0;  fluconazole (DIFLUCAN) 150 MG tablet, 1  po stat; repeat in 3 days, Disp: 2 tablet, Rfl: 2;  LORazepam (ATIVAN) 1 MG tablet, Take 1 mg by mouth every 8 (eight) hours., Disp: , Rfl: ;  metroNIDAZOLE (FLAGYL) 500 MG tablet, Take 1 tablet (500 mg total) by mouth 2 (two) times daily. X 7 days, Disp: 14 tablet, Rfl: 0 Norethindrone-Ethinyl Estradiol-Fe Biphas (LO LOESTRIN FE) 1 MG-10 MCG / 10 MCG tablet, Take 1 tablet by mouth daily., Disp: 1 Package, Rfl: 11  Review of Systems:   Review of Systems  Constitutional: Negative for fever, chills, weight loss, malaise/fatigue and diaphoresis.  HENT: Negative for hearing loss, ear pain, nosebleeds, congestion, sore throat, neck pain, tinnitus and ear discharge.   Eyes: Negative for blurred vision, double vision, photophobia, pain, discharge and redness.  Respiratory: Negative for cough, hemoptysis, sputum production, shortness of  breath, wheezing and stridor.   Cardiovascular: Negative for chest pain, palpitations, orthopnea, claudication, leg swelling and PND.  Gastrointestinal: Positive for abdominal pain. Negative for heartburn, nausea, vomiting, diarrhea, constipation, blood in stool and melena.  Genitourinary: Negative for dysuria, urgency, frequency, hematuria and flank pain.  Musculoskeletal: Negative for myalgias, back pain, joint pain and falls.  Skin: Negative for itching and rash.  Neurological: Negative for dizziness, tingling, tremors, sensory change, speech change, focal weakness, seizures, loss of consciousness, weakness and headaches.  Endo/Heme/Allergies: Negative for environmental allergies and polydipsia. Does not bruise/bleed easily.  Psychiatric/Behavioral: Negative for depression, suicidal ideas, hallucinations, memory loss and substance abuse. The patient is not nervous/anxious and does not have insomnia.      PHYSICAL EXAM:  Blood pressure 110/70, weight 133 lb (60.328 kg), last menstrual period 07/23/2014.    Vitals reviewed. Constitutional: She is oriented to person, place, and time. She appears well-developed and well-nourished.  HENT:  Head: Normocephalic and atraumatic.  Right Ear: External ear normal.  Left Ear: External ear normal.  Nose: Nose normal.  Mouth/Throat: Oropharynx is clear and moist.  Eyes: Conjunctivae and EOM are normal. Pupils are equal, round, and reactive to light. Right eye exhibits no discharge. Left eye exhibits no discharge. No scleral icterus.  Neck: Normal range of motion. Neck supple. No tracheal deviation present. No thyromegaly present.  Cardiovascular: Normal rate, regular rhythm, normal heart sounds and intact distal pulses.  Exam reveals no gallop and no friction rub.   No murmur heard. Respiratory: Effort normal and breath sounds normal. No respiratory distress. She has no wheezes. She has no rales. She exhibits no tenderness.  GI: Soft. Bowel sounds  are normal. She exhibits no distension and no mass. There is tenderness. There is no rebound and no guarding.  Genitourinary:       Vulva is normal without lesions Vagina is pink moist without discharge Cervix normal in appearance and pap is normal Uterus is normal Adnexa is negative with normal sized ovaries by sonogram  Musculoskeletal: Normal range of motion. She exhibits no edema and no tenderness.  Neurological: She is alert and oriented to person, place, and time. She has normal reflexes. She displays normal reflexes. No cranial nerve deficit. She exhibits normal muscle tone. Coordination normal.  Skin: Skin is warm and dry. No rash noted. No erythema. No pallor.  Psychiatric: She has a normal mood and affect. Her behavior is normal. Judgment and thought content normal.    Labs: No results found for this or any previous visit (from the past 336 hour(s)).  EKG: No orders found for this or any previous visit.  Imaging Studies: [place] Transvaginal Non-ob  08/06/2014   GYNECOLOGIC SONOGRAM   LAFAWN LENOIR is a 38 y.o. [id number] LMP 07/23/2014 for a pelvic sonogram for  menorrhagia and pelvic pain.  Uterus                      8.1 x 5.8 x 4.4 cm, retroverted , posterior  "streaky" shadows noted although no obvious myometrial mass noted within  the uterus  Endometrium          7.8 mm, symmetrical   Right ovary             3.6 x 1.7 x 1.6 cm,   Left ovary                3.1 x 2.0 x 1.7 cm,   No free fluid or adnexal masses noted within the pelvis   Technician Comments:  Retroverted uterus with posterior "streaky" shadows noted (?adenomyosis),  Endom-7.53mm symmetrical, bilateral adnexa/ovaries appears WNL, no free  fluid or adnexal masses noted within the pelvis   Chari Manning 08/06/2014 2:25 PM   Clinical Impression and recommendations:  I have reviewed the sonogram results above, combined with the patient's  current clinical course, below are my impressions and any appropriate  recommendations for  management based on the sonographic findings.  Normal gynecologic sonogram, suggestion of adenomyosis   EURE,LUTHER H 08/06/2014 3:21 PM       Assessment: dysmneorrhea Dyspareunia menorrhagia Patient Active Problem List   Diagnosis Date Noted  . Endometriosis 08/05/2014  . Pelvic pain in female 08/05/2014  . Menorrhagia with regular cycle 07/29/2014  . Fibromyalgia 03/27/2013  . Anxiety 03/27/2013  . Breast lump 03/27/2013    Plan: [facility] 09/02/2014  Pt understands the risks of surgery including but not limited t  excessive bleeding requiring transfusion or reoperation, post-operative infection requiring prolonged hospitalization or re-hospitalization and antibiotic therapy, and damage to other organs including bladder, bowel, ureters and major vessels.  The patient also understands the alternative treatment options which were discussed in full.  All questions were answered.  EURE,LUTHER H 08/27/2014 11:44 AM   EURE,LUTHER H 08/27/2014 11:42 AM

## 2014-08-28 ENCOUNTER — Encounter (HOSPITAL_COMMUNITY)
Admission: RE | Admit: 2014-08-28 | Discharge: 2014-08-28 | Disposition: A | Discharge status: Home / Self Care | Payer: Medicaid Other | Source: Ambulatory Visit | Attending: Obstetrics & Gynecology | Admitting: Obstetrics & Gynecology

## 2014-08-28 ENCOUNTER — Encounter (HOSPITAL_COMMUNITY): Payer: Self-pay

## 2014-08-28 DIAGNOSIS — Z01812 Encounter for preprocedural laboratory examination: Secondary | ICD-10-CM | POA: Insufficient documentation

## 2014-08-28 HISTORY — DX: Anemia, unspecified: D64.9

## 2014-08-28 LAB — URINALYSIS, ROUTINE W REFLEX MICROSCOPIC
Bilirubin Urine: NEGATIVE
Glucose, UA: NEGATIVE mg/dL
Ketones, ur: NEGATIVE mg/dL
LEUKOCYTES UA: NEGATIVE
Nitrite: NEGATIVE
PH: 5.5 (ref 5.0–8.0)
Protein, ur: NEGATIVE mg/dL
Specific Gravity, Urine: >1.03 — ABNORMAL HIGH (ref 1.005–1.030)
Urobilinogen, UA: 0.2 mg/dL (ref 0.0–1.0)

## 2014-08-28 LAB — COMPREHENSIVE METABOLIC PANEL
ALT: 11 U/L (ref 0–35)
AST: 17 U/L (ref 0–37)
Albumin: 4.5 g/dL (ref 3.5–5.2)
Alkaline Phosphatase: 63 U/L (ref 39–117)
Anion gap: 13 (ref 5–15)
BILIRUBIN TOTAL: 0.3 mg/dL (ref 0.3–1.2)
BUN: 10 mg/dL (ref 6–23)
CO2: 25 meq/L (ref 19–32)
CREATININE: 0.78 mg/dL (ref 0.50–1.10)
Calcium: 9.8 mg/dL (ref 8.4–10.5)
Chloride: 104 mEq/L (ref 96–112)
GFR calc Af Amer: >90 mL/min (ref >90)
GFR calc non Af Amer: >90 mL/min (ref >90)
Glucose, Bld: 79 mg/dL (ref 70–99)
Potassium: 4.4 mEq/L (ref 3.7–5.3)
Sodium: 142 mEq/L (ref 137–147)
Total Protein: 8.2 g/dL (ref 6.0–8.3)

## 2014-08-28 LAB — TYPE AND SCREEN
ABO/RH(D): O POS
Antibody Screen: NEGATIVE

## 2014-08-28 LAB — CBC
HCT: 37.7 % (ref 36.0–46.0)
HEMOGLOBIN: 11.3 g/dL — AB (ref 12.0–15.0)
MCH: 24.5 pg — AB (ref 26.0–34.0)
MCHC: 30 g/dL (ref 30.0–36.0)
MCV: 81.6 fL (ref 78.0–100.0)
PLATELETS: 221 10*3/uL (ref 150–400)
RBC: 4.62 MIL/uL (ref 3.87–5.11)
RDW: 18.5 % — ABNORMAL HIGH (ref 11.5–15.5)
WBC: 5.7 10*3/uL (ref 4.0–10.5)

## 2014-08-28 LAB — GC/CHLAMYDIA PROBE AMP
CT PROBE, AMP APTIMA: NEGATIVE
GC Probe RNA: NEGATIVE

## 2014-08-28 LAB — URINE MICROSCOPIC-ADD ON

## 2014-08-28 LAB — HCG, QUANTITATIVE, PREGNANCY

## 2014-08-28 NOTE — Patient Instructions (Signed)
Kathryn Golden  08/28/2014   Your procedure is scheduled on:  09/02/2014  [facility]  [facility].  Call this number if you have problems the morning of surgery: [phone]   Remember:   Do not eat food or drink liquids after midnight.   Take these medicines the morning of surgery with A SIP OF WATER:  Klonopin, hydrocodone, robaxin, lyrica, topamax   Do not wear jewelry, make-up or nail polish.  Do not wear lotions, powders, or perfumes.  Do not shave 48 hours prior to surgery. Men may shave face and neck.  Do not bring valuables to the hospital.  [facility] not [facility] any belongings or valuables.               Contacts, dentures or bridgework may not be worn into surgery.  Leave suitcase in the car. After surgery it may be brought to your room.  For patients admitted to the hospital, discharge time is determined by your treatment team.               Patients discharged the day of surgery will not be allowed to drive home.  Name and phone number of your driver: family Special Instructions: Shower using CHG 2 nights before surgery and the night before surgery.  If you shower the day of surgery use CHG.  Use special wash - you have one bottle of CHG for all showers.  You should use approximately 1/3 of the bottle for each shower.   Please read over the following fact sheets that you were given: Pain Booklet, Coughing and Deep Breathing, Surgical Site Infection Prevention, Anesthesia Post-op Instructions and Care and Recovery After Surgery Hysterectomy Information A hysterectomy is a surgery to remove your uterus. After surgery, you will no longer have periods. Also, you will not be able to get pregnant.  REASONS FOR THIS SURGERY  You have bleeding that is not normal and keeps coming back.  You have lasting (chronic) lower belly (pelvic) pain.  You have a lasting infection.  The lining of your uterus grows outside your uterus.  The lining of your uterus  grows in the muscle of your uterus.  Your uterus falls down into your vagina.  You have a growth in your uterus that causes problems.  You have cells that could turn into cancer (precancerous cells).  You have cancer of the uterus or cervix. TYPES  There are 3 types of hysterectomies. Depending on the type, the surgery will:  Remove the top part of the uterus only.  Remove the uterus and the cervix.  Remove the uterus, cervix, and tissue that holds the uterus in place in the lower belly. WAYS A HYSTERECTOMY CAN BE PERFORMED There are 5 ways this surgery can be performed.   A cut (incision) is made in the belly (abdomen). The uterus is taken out through the cut.  A cut is made in the vagina. The uterus is taken out through the cut.  Three or four cuts are made in the belly. A surgical device with a camera is put through one of the cuts. The uterus is cut into small pieces. The uterus is taken out through the cuts or the vagina.  Three or four cuts are made in the belly. A surgical device with a camera is put through one of the cuts. The uterus is taken out through the vagina.  Three or four cuts are made in the  belly. A surgical device that is controlled by a computer makes a visual image. The device helps the surgeon control the surgical tools. The uterus is cut into small pieces. The pieces are taken out through the cuts or through the vagina. WHAT TO EXPECT AFTER THE SURGERY  You will be given pain medicine.  You will need help at home for 3-5 days after surgery.  You will need to see your doctor in 2-4 weeks after surgery.  You may get hot flashes, have night sweats, and have trouble sleeping.  You may need to have Pap tests in the future if your surgery was related to cancer. Talk to your doctor. It is still good to have regular exams. Document Released: 01/01/2012 Document Revised: 07/30/2013 Document Reviewed: 06/16/2013 [facility] Patient Information 2015 [employer]  [place]. This information is not intended to replace advice given to you by your health care provider. Make sure you discuss any questions you have with your health care provider. PATIENT INSTRUCTIONS POST-ANESTHESIA  IMMEDIATELY FOLLOWING SURGERY:  Do not drive or operate machinery for the first twenty four hours after surgery.  Do not make any important decisions for twenty four hours after surgery or while taking narcotic pain medications or sedatives.  If you develop intractable nausea and vomiting or a severe headache please notify your doctor immediately.  FOLLOW-UP:  Please make an appointment with your surgeon as instructed. You do not need to follow up with anesthesia unless specifically instructed to do so.  WOUND CARE INSTRUCTIONS (if applicable):  Keep a dry clean dressing on the anesthesia/puncture wound site if there is drainage.  Once the wound has quit draining you may leave it open to air.  Generally you should leave the bandage intact for twenty four hours unless there is drainage.  If the epidural site drains for more than 36-48 hours please call the anesthesia department.  QUESTIONS?:  Please feel free to call your physician or the hospital operator if you have any questions, and they will be happy to assist you.

## 2014-08-31 ENCOUNTER — Telehealth: Payer: Self-pay | Admitting: Obstetrics & Gynecology

## 2014-08-31 NOTE — Telephone Encounter (Signed)
Pt informed of WNL results from 08/28/2014 and negative GC/CHL results from 08/27/2014.

## 2014-09-02 ENCOUNTER — Encounter (HOSPITAL_COMMUNITY): Payer: Self-pay | Admitting: *Deleted

## 2014-09-02 ENCOUNTER — Ambulatory Visit (HOSPITAL_COMMUNITY): Payer: Medicaid Other | Admitting: Anesthesiology

## 2014-09-02 ENCOUNTER — Other Ambulatory Visit: Payer: Self-pay | Admitting: Obstetrics & Gynecology

## 2014-09-02 ENCOUNTER — Encounter (HOSPITAL_COMMUNITY)
Admission: RE | Disposition: A | Discharge status: Home / Self Care | Payer: Self-pay | Source: Ambulatory Visit | Attending: Obstetrics & Gynecology

## 2014-09-02 ENCOUNTER — Ambulatory Visit (HOSPITAL_COMMUNITY)
Admission: RE | Admit: 2014-09-02 | Discharge: 2014-09-02 | Disposition: A | Discharge status: Home / Self Care | Payer: Medicaid Other | Source: Ambulatory Visit | Attending: Obstetrics & Gynecology | Admitting: Obstetrics & Gynecology

## 2014-09-02 DIAGNOSIS — M797 Fibromyalgia: Secondary | ICD-10-CM | POA: Diagnosis not present

## 2014-09-02 DIAGNOSIS — N800 Endometriosis of uterus: Secondary | ICD-10-CM | POA: Diagnosis not present

## 2014-09-02 DIAGNOSIS — N946 Dysmenorrhea, unspecified: Secondary | ICD-10-CM | POA: Diagnosis present

## 2014-09-02 DIAGNOSIS — N941 Dyspareunia: Secondary | ICD-10-CM | POA: Insufficient documentation

## 2014-09-02 DIAGNOSIS — F329 Major depressive disorder, single episode, unspecified: Secondary | ICD-10-CM | POA: Diagnosis not present

## 2014-09-02 DIAGNOSIS — Z90710 Acquired absence of both cervix and uterus: Secondary | ICD-10-CM | POA: Diagnosis present

## 2014-09-02 DIAGNOSIS — F909 Attention-deficit hyperactivity disorder, unspecified type: Secondary | ICD-10-CM | POA: Insufficient documentation

## 2014-09-02 DIAGNOSIS — N736 Female pelvic peritoneal adhesions (postinfective): Secondary | ICD-10-CM | POA: Diagnosis not present

## 2014-09-02 DIAGNOSIS — F419 Anxiety disorder, unspecified: Secondary | ICD-10-CM | POA: Diagnosis not present

## 2014-09-02 DIAGNOSIS — F17210 Nicotine dependence, cigarettes, uncomplicated: Secondary | ICD-10-CM | POA: Diagnosis not present

## 2014-09-02 DIAGNOSIS — N920 Excessive and frequent menstruation with regular cycle: Secondary | ICD-10-CM | POA: Insufficient documentation

## 2014-09-02 HISTORY — PX: VAGINAL HYSTERECTOMY: SHX2639

## 2014-09-02 SURGERY — HYSTERECTOMY, VAGINAL
Anesthesia: General

## 2014-09-02 MED ORDER — FENTANYL CITRATE 0.05 MG/ML IJ SOLN
25.0000 ug | INTRAMUSCULAR | Status: DC | PRN
  Administered 2014-09-02 (×2): 50 ug via INTRAVENOUS
  Filled 2014-09-02: qty 2

## 2014-09-02 MED ORDER — SODIUM CHLORIDE 0.9 % IR SOLN
Status: DC | PRN
  Administered 2014-09-02: 1000 mL

## 2014-09-02 MED ORDER — HYDROMORPHONE HCL 1 MG/ML IJ SOLN
1.0000 mg | INTRAMUSCULAR | Status: DC | PRN
  Administered 2014-09-02: 1 mg via INTRAVENOUS
  Filled 2014-09-02: qty 1

## 2014-09-02 MED ORDER — CLONAZEPAM 0.5 MG PO TABS
1.0000 mg | ORAL_TABLET | Freq: Two times a day (BID) | ORAL | Status: DC
  Filled 2014-09-02: qty 2

## 2014-09-02 MED ORDER — DEXAMETHASONE SODIUM PHOSPHATE 4 MG/ML IJ SOLN
INTRAMUSCULAR | Status: AC
  Filled 2014-09-02: qty 1

## 2014-09-02 MED ORDER — GLYCOPYRROLATE 0.2 MG/ML IJ SOLN
INTRAMUSCULAR | Status: DC | PRN
  Administered 2014-09-02: 0.2 mg via INTRAVENOUS
  Administered 2014-09-02: 0.4 mg via INTRAVENOUS

## 2014-09-02 MED ORDER — ACETAMINOPHEN 500 MG PO TABS: 500.0000 mg | ORAL_TABLET | Freq: Four times a day (QID) | ORAL | Status: DC | PRN

## 2014-09-02 MED ORDER — MIDAZOLAM HCL 2 MG/2ML IJ SOLN
INTRAMUSCULAR | Status: AC
  Filled 2014-09-02: qty 2

## 2014-09-02 MED ORDER — ASPIRIN EC 325 MG PO TBEC: 650.0000 mg | DELAYED_RELEASE_TABLET | Freq: Four times a day (QID) | ORAL | Status: DC | PRN

## 2014-09-02 MED ORDER — OXYCODONE-ACETAMINOPHEN 5-325 MG PO TABS
1.0000 | ORAL_TABLET | ORAL | Status: DC | PRN
  Administered 2014-09-02: 2 via ORAL
  Filled 2014-09-02: qty 2

## 2014-09-02 MED ORDER — ONDANSETRON HCL 4 MG/2ML IJ SOLN
INTRAMUSCULAR | Status: AC
  Filled 2014-09-02: qty 2

## 2014-09-02 MED ORDER — GLYCOPYRROLATE 0.2 MG/ML IJ SOLN
INTRAMUSCULAR | Status: AC
  Filled 2014-09-02: qty 1

## 2014-09-02 MED ORDER — DOCUSATE SODIUM 100 MG PO CAPS
100.0000 mg | ORAL_CAPSULE | Freq: Two times a day (BID) | ORAL | Status: DC
  Administered 2014-09-02: 100 mg via ORAL
  Filled 2014-09-02: qty 1

## 2014-09-02 MED ORDER — INFLUENZA VAC SPLIT QUAD 0.5 ML IM SUSY
0.5000 mL | PREFILLED_SYRINGE | INTRAMUSCULAR | Status: DC
  Filled 2014-09-02: qty 0.5

## 2014-09-02 MED ORDER — KETOROLAC TROMETHAMINE 30 MG/ML IJ SOLN
30.0000 mg | Freq: Once | INTRAMUSCULAR | Status: AC
  Administered 2014-09-02: 30 mg via INTRAVENOUS

## 2014-09-02 MED ORDER — OXYCODONE-ACETAMINOPHEN 7.5-325 MG PO TABS: 1.0000 | ORAL_TABLET | Freq: Four times a day (QID) | ORAL | Status: DC | PRN

## 2014-09-02 MED ORDER — CIPROFLOXACIN HCL 500 MG PO TABS: 500.0000 mg | ORAL_TABLET | Freq: Two times a day (BID) | ORAL | Status: DC

## 2014-09-02 MED ORDER — PROPOFOL 10 MG/ML IV BOLUS
INTRAVENOUS | Status: DC | PRN
  Administered 2014-09-02: 170 mg via INTRAVENOUS

## 2014-09-02 MED ORDER — MIDAZOLAM HCL 2 MG/2ML IJ SOLN
1.0000 mg | INTRAMUSCULAR | Status: DC | PRN
  Administered 2014-09-02 (×2): 2 mg via INTRAVENOUS

## 2014-09-02 MED ORDER — ONDANSETRON 8 MG/NS 50 ML IVPB
8.0000 mg | Freq: Four times a day (QID) | INTRAVENOUS | Status: DC | PRN
  Filled 2014-09-02: qty 8

## 2014-09-02 MED ORDER — ALUM & MAG HYDROXIDE-SIMETH 200-200-20 MG/5ML PO SUSP
30.0000 mL | ORAL | Status: DC | PRN
  Administered 2014-09-02: 30 mL via ORAL
  Filled 2014-09-02: qty 30

## 2014-09-02 MED ORDER — CEFAZOLIN SODIUM-DEXTROSE 2-3 GM-% IV SOLR
2.0000 g | INTRAVENOUS | Status: AC
  Administered 2014-09-02: 2 g via INTRAVENOUS

## 2014-09-02 MED ORDER — ROCURONIUM BROMIDE 100 MG/10ML IV SOLN
INTRAVENOUS | Status: DC | PRN
  Administered 2014-09-02: 40 mg via INTRAVENOUS

## 2014-09-02 MED ORDER — LACTATED RINGERS IV SOLN
INTRAVENOUS | Status: DC
  Administered 2014-09-02 (×2): via INTRAVENOUS

## 2014-09-02 MED ORDER — ASPIRIN-ACETAMINOPHEN-CAFFEINE 250-250-65 MG PO TABS: 2.0000 | ORAL_TABLET | Freq: Four times a day (QID) | ORAL | Status: DC | PRN

## 2014-09-02 MED ORDER — PREGABALIN 50 MG PO CAPS: 100.0000 mg | ORAL_CAPSULE | Freq: Every evening | ORAL | Status: DC | PRN

## 2014-09-02 MED ORDER — KCL IN DEXTROSE-NACL 20-5-0.45 MEQ/L-%-% IV SOLN
INTRAVENOUS | Status: DC
  Administered 2014-09-02: 14:00:00 via INTRAVENOUS

## 2014-09-02 MED ORDER — PROPOFOL 10 MG/ML IV BOLUS
INTRAVENOUS | Status: AC
  Filled 2014-09-02: qty 20

## 2014-09-02 MED ORDER — ZOLPIDEM TARTRATE 5 MG PO TABS: 5.0000 mg | ORAL_TABLET | Freq: Every evening | ORAL | Status: DC | PRN

## 2014-09-02 MED ORDER — METHOCARBAMOL 500 MG PO TABS
500.0000 mg | ORAL_TABLET | Freq: Four times a day (QID) | ORAL | Status: DC
  Administered 2014-09-02: 500 mg via ORAL
  Filled 2014-09-02: qty 1

## 2014-09-02 MED ORDER — 0.9 % SODIUM CHLORIDE (POUR BTL) OPTIME
TOPICAL | Status: DC | PRN
  Administered 2014-09-02: 1000 mL

## 2014-09-02 MED ORDER — FENTANYL CITRATE 0.05 MG/ML IJ SOLN
INTRAMUSCULAR | Status: AC
  Filled 2014-09-02: qty 5

## 2014-09-02 MED ORDER — BUPIVACAINE-EPINEPHRINE (PF) 0.5% -1:200000 IJ SOLN
INTRAMUSCULAR | Status: AC
  Filled 2014-09-02: qty 30

## 2014-09-02 MED ORDER — GLYCOPYRROLATE 0.2 MG/ML IJ SOLN
INTRAMUSCULAR | Status: AC
  Filled 2014-09-02: qty 2

## 2014-09-02 MED ORDER — ONDANSETRON HCL 4 MG/2ML IJ SOLN: 4.0000 mg | Freq: Once | INTRAMUSCULAR | Status: DC | PRN

## 2014-09-02 MED ORDER — ONDANSETRON HCL 8 MG PO TABS: 8.0000 mg | ORAL_TABLET | Freq: Three times a day (TID) | ORAL | Status: DC | PRN

## 2014-09-02 MED ORDER — ONDANSETRON HCL 4 MG PO TABS: 8.0000 mg | ORAL_TABLET | Freq: Four times a day (QID) | ORAL | Status: DC | PRN

## 2014-09-02 MED ORDER — NEOSTIGMINE METHYLSULFATE 10 MG/10ML IV SOLN
INTRAVENOUS | Status: AC
  Filled 2014-09-02: qty 1

## 2014-09-02 MED ORDER — KETOROLAC TROMETHAMINE 30 MG/ML IJ SOLN
INTRAMUSCULAR | Status: AC
  Filled 2014-09-02: qty 1

## 2014-09-02 MED ORDER — FENTANYL CITRATE 0.05 MG/ML IJ SOLN
INTRAMUSCULAR | Status: DC | PRN
  Administered 2014-09-02: 100 ug via INTRAVENOUS
  Administered 2014-09-02 (×2): 50 ug via INTRAVENOUS
  Administered 2014-09-02: 100 ug via INTRAVENOUS
  Administered 2014-09-02: 50 ug via INTRAVENOUS

## 2014-09-02 MED ORDER — LIDOCAINE HCL (PF) 1 % IJ SOLN
INTRAMUSCULAR | Status: AC
  Filled 2014-09-02: qty 5

## 2014-09-02 MED ORDER — CEFAZOLIN SODIUM 1-5 GM-% IV SOLN
INTRAVENOUS | Status: AC
  Filled 2014-09-02: qty 100

## 2014-09-02 MED ORDER — BUPIVACAINE-EPINEPHRINE (PF) 0.5% -1:200000 IJ SOLN
INTRAMUSCULAR | Status: DC | PRN
  Administered 2014-09-02: 14 mL

## 2014-09-02 MED ORDER — ROCURONIUM BROMIDE 50 MG/5ML IV SOLN
INTRAVENOUS | Status: AC
  Filled 2014-09-02: qty 1

## 2014-09-02 MED ORDER — TOPIRAMATE 25 MG PO TABS
50.0000 mg | ORAL_TABLET | Freq: Two times a day (BID) | ORAL | Status: DC
  Filled 2014-09-02 (×5): qty 2

## 2014-09-02 MED ORDER — MIDAZOLAM HCL 5 MG/5ML IJ SOLN
INTRAMUSCULAR | Status: DC | PRN
  Administered 2014-09-02: 2 mg via INTRAVENOUS

## 2014-09-02 MED ORDER — SIMETHICONE 80 MG PO CHEW: 80.0000 mg | CHEWABLE_TABLET | Freq: Four times a day (QID) | ORAL | Status: DC | PRN

## 2014-09-02 MED ORDER — NEOSTIGMINE METHYLSULFATE 10 MG/10ML IV SOLN
INTRAVENOUS | Status: DC | PRN
  Administered 2014-09-02 (×3): 1 mg via INTRAVENOUS

## 2014-09-02 MED ORDER — DEXAMETHASONE SODIUM PHOSPHATE 4 MG/ML IJ SOLN
4.0000 mg | Freq: Once | INTRAMUSCULAR | Status: AC
  Administered 2014-09-02: 4 mg via INTRAVENOUS

## 2014-09-02 MED ORDER — LIDOCAINE HCL 1 % IJ SOLN
INTRAMUSCULAR | Status: DC | PRN
  Administered 2014-09-02: 50 mg via INTRADERMAL

## 2014-09-02 MED ORDER — KETOROLAC TROMETHAMINE 10 MG PO TABS: 10.0000 mg | ORAL_TABLET | Freq: Three times a day (TID) | ORAL | Status: DC | PRN

## 2014-09-02 MED ORDER — ONDANSETRON HCL 4 MG/2ML IJ SOLN
4.0000 mg | Freq: Once | INTRAMUSCULAR | Status: AC
  Administered 2014-09-02: 4 mg via INTRAVENOUS

## 2014-09-02 SURGICAL SUPPLY — 44 items
APPLIER CLIP 13 LRG OPEN (CLIP) ×3
APR CLP LRG 13 20 CLIP (CLIP) ×1
BAG HAMPER (MISCELLANEOUS) ×3 IMPLANT
CLIP APPLIE 13 LRG OPEN (CLIP) IMPLANT
CLOTH BEACON ORANGE TIMEOUT ST (SAFETY) ×3 IMPLANT
COVER LIGHT HANDLE STERIS (MISCELLANEOUS) ×6 IMPLANT
COVER MAYO STAND XLG (DRAPE) ×3 IMPLANT
DECANTER SPIKE VIAL GLASS SM (MISCELLANEOUS) ×3 IMPLANT
DRAPE PROXIMA HALF (DRAPES) ×3 IMPLANT
DRAPE STERI URO 9X17 APER PCH (DRAPES) ×3 IMPLANT
ELECT REM PT RETURN 9FT ADLT (ELECTROSURGICAL) ×3
ELECTRODE REM PT RTRN 9FT ADLT (ELECTROSURGICAL) ×1 IMPLANT
FORMALIN 10 PREFIL 480ML (MISCELLANEOUS) ×3 IMPLANT
GAUZE PACKING 2X5 YD STRL (GAUZE/BANDAGES/DRESSINGS) IMPLANT
GLOVE BIOGEL PI IND STRL 7.0 (GLOVE) IMPLANT
GLOVE BIOGEL PI IND STRL 7.5 (GLOVE) IMPLANT
GLOVE BIOGEL PI IND STRL 8 (GLOVE) ×1 IMPLANT
GLOVE BIOGEL PI INDICATOR 7.0 (GLOVE) ×4
GLOVE BIOGEL PI INDICATOR 7.5 (GLOVE) ×4
GLOVE BIOGEL PI INDICATOR 8 (GLOVE) ×2
GLOVE ECLIPSE 6.5 STRL STRAW (GLOVE) ×4 IMPLANT
GLOVE ECLIPSE 8.0 STRL XLNG CF (GLOVE) ×3 IMPLANT
GOWN STRL REUS W/TWL LRG LVL3 (GOWN DISPOSABLE) ×9 IMPLANT
GOWN STRL REUS W/TWL XL LVL3 (GOWN DISPOSABLE) ×3 IMPLANT
IV NS 1000ML (IV SOLUTION) ×3
IV NS 1000ML BAXH (IV SOLUTION) IMPLANT
KIT ROOM TURNOVER AP CYSTO (KITS) ×3 IMPLANT
MANIFOLD NEPTUNE II (INSTRUMENTS) ×3 IMPLANT
NDL HYPO 25X1 1.5 SAFETY (NEEDLE) ×1 IMPLANT
NEEDLE HYPO 25X1 1.5 SAFETY (NEEDLE) ×3 IMPLANT
NS IRRIG 1000ML POUR BTL (IV SOLUTION) ×3 IMPLANT
PACK PERI GYN (CUSTOM PROCEDURE TRAY) ×3 IMPLANT
PAD ARMBOARD 7.5X6 YLW CONV (MISCELLANEOUS) ×3 IMPLANT
SET BASIN LINEN APH (SET/KITS/TRAYS/PACK) ×3 IMPLANT
SUT MNCRL+ AB 3-0 CT1 36 (SUTURE) IMPLANT
SUT MON AB 3-0 SH 27 (SUTURE) ×2 IMPLANT
SUT MONOCRYL AB 3-0 CT1 36IN (SUTURE)
SUT VIC AB 0 CT1 27 (SUTURE) ×9
SUT VIC AB 0 CT1 27XCR 8 STRN (SUTURE) ×2 IMPLANT
SYRINGE CONTROL L 12CC (SYRINGE) ×3 IMPLANT
SYRINGE CONTROL LL 12CC (SYRINGE) ×1 IMPLANT
TOWEL OR 17X26 4PK STRL BLUE (TOWEL DISPOSABLE) ×2 IMPLANT
TRAY FOLEY CATH 16FR SILVER (SET/KITS/TRAYS/PACK) ×3 IMPLANT
VERSALIGHT (MISCELLANEOUS) ×3 IMPLANT

## 2014-09-02 NOTE — Op Note (Signed)
Preoperative diagnosis:  1.  menorrhagia                                         2.  dysmenorrhea                                         3.  dyspareunia                                         4.  adenomyosis  Postoperative diagnosis:  Same as above   Procedure:  Vaginal hysterectomy  Surgeon:  Lazaro ArmsLuther H Linday Rhodes MD  Anesthesia:  General Endotracheal  Findings:  Uterus normal size soft consistent with adenomyosis, remainder of tubes normal, ovaries normal peritoneal surfaces normal  Description of operation:  The patient was taken from the preoperative area to the operating room in stable condition. She was placed in the sitting position and underwent a spinal anesthetic. Once an adequate level of anesthesia was attained she was placed in the dorsal lithotomy position. Patient was prepped and draped in the usual sterile fashion and a Foley catheter was placed.  A weighted speculum was placed and the cervix was grasped with thyroid tenaculums both anteriorly and posteriorly.  0.5% Marcaine plain was injected in a circumferential fashion about the cervix and the electrocautery unit was used to incise the vagina and push at all cervix.  The posterior cul-de-sac was then entered sharply without difficulty.  The uterosacral ligaments were clamped cut and tranfixion suture ligated and held.  The cardinal ligaments were then clamped cut transfixion suture ligated and cut. The anterior peritoneum was identified the anterior cul-de-sac was entered sharply without difficulty. The anterior and posterior leaves of the broad ligament were plicated and the uterine vessels were clamped cut and suture ligated. Serial pedicles were taken up the fundus with each pedicle being clamped cut and suture ligated. The utero-ovarian ligaments were cross clamped the uterus was removed and both pedicles were transfixion suture ligated. Large hemo clips were used to "back" ligate the utero ovarian ligament for additional pedicle  security.  There was good hemostasis of all the pedicles. The peritoneum was then closed in a pursestring fashion using 3-0 Vicryl. The vagina was closed anterior to posterior in an interrupted fashion with good resultant hemostasis. Before closure the lower pelvis and vagina were irrigated vigorously.  The sponge needle and instrument counts were correct x 3.  Total blood loss for the procedure was 100 cc.  The patient received 2 g of Ancef and 30 mg of Toradol IV preoperatively prophylactically.  She was taken to the recovery room in good stable condition awake alert doing well.  Deshia Vanderhoof H 09/02/2014, 11:57 AM

## 2014-09-02 NOTE — Discharge Instructions (Signed)

## 2014-09-02 NOTE — Progress Notes (Signed)
NURSING PROGRESS NOTE  Bonner PunaKristy N Osgood [id number] Discharge Data: 09/02/2014 5:04 PM Attending Provider: No att. providers found NWG:[id number],QMVHQPCP:MCINNIS,ANGUS Reece AgarG, MD   Bonner PunaKristy N Brunkow to be D/C'd Home per MD order.    All IV's discontinued and monitored for bleeding.  All belongings returned to patient for patient to take home.  AVS summary and prescriptions reviewed with patient.  Patient left floor ambulatory, escorted by RN.  Last Documented Vital Signs:  Blood pressure 114/70, pulse 71, temperature 98.1 F (36.7 C), temperature source Oral, resp. rate 18, height 5\' 8"  (1.727 m), weight 60.328 kg (133 lb), SpO2 99 %.  Mertha BaarsHorine, Jodeen Mclin D

## 2014-09-02 NOTE — Anesthesia Preprocedure Evaluation (Signed)
Anesthesia Evaluation  Patient identified by MRN, date of birth, ID band Patient awake    Reviewed: Allergy & Precautions, H&P , NPO status , Patient's Chart, lab work & pertinent test results  History of Anesthesia Complications (+) PONV  Airway Mallampati: II  TM Distance: >3 FB Neck ROM: Full    Dental no notable dental hx. (+) Teeth Intact   Pulmonary neg pulmonary ROS, Current Smoker,  breath sounds clear to auscultation  Pulmonary exam normal       Cardiovascular hypertension, negative cardio ROS  Rhythm:Regular Rate:Normal     Neuro/Psych  Headaches, PSYCHIATRIC DISORDERS (ADHD) Anxiety Depression negative neurological ROS  negative psych ROS   GI/Hepatic negative GI ROS, Neg liver ROS, GERD-  Medicated and Controlled,  Endo/Other  negative endocrine ROS  Renal/GU negative Renal ROS  negative genitourinary   Musculoskeletal  (+) Fibromyalgia -  Abdominal   Peds  Hematology negative hematology ROS (+)   Anesthesia Other Findings   Reproductive/Obstetrics negative OB ROS                             Anesthesia Physical Anesthesia Plan  ASA: II  Anesthesia Plan: General   Post-op Pain Management:    Induction: Intravenous, Rapid sequence and Cricoid pressure planned  Airway Management Planned: Oral ETT  Additional Equipment:   Intra-op Plan:   Post-operative Plan: Extubation in OR  Informed Consent: I have reviewed the patients History and Physical, chart, labs and discussed the procedure including the risks, benefits and alternatives for the proposed anesthesia with the patient or authorized representative who has indicated his/her understanding and acceptance.     Plan Discussed with:   Anesthesia Plan Comments:         Anesthesia Quick Evaluation

## 2014-09-02 NOTE — Transfer of Care (Signed)
Immediate Anesthesia Transfer of Care Note  Patient: Kathryn PunaKristy N Drollinger  Procedure(s) Performed: Procedure(s): HYSTERECTOMY VAGINAL (N/A)  Patient Location: PACU  Anesthesia Type:General  Level of Consciousness: awake and patient cooperative  Airway & Oxygen Therapy: Patient Spontanous Breathing and Patient connected to face mask oxygen  Post-op Assessment: Report given to PACU RN, Post -op Vital signs reviewed and stable and Patient moving all extremities  Post vital signs: Reviewed and stable  Complications: No apparent anesthesia complications

## 2014-09-02 NOTE — Anesthesia Postprocedure Evaluation (Signed)
  Anesthesia Post-op Note  Patient: Kathryn Golden  Procedure(s) Performed: Procedure(s): HYSTERECTOMY VAGINAL (N/A)  Patient Location: PACU  Anesthesia Type:General  Level of Consciousness: awake, alert , oriented and patient cooperative  Airway and Oxygen Therapy: Patient Spontanous Breathing  Post-op Pain: 3 /10, mild  Post-op Assessment: Post-op Vital signs reviewed, Patient's Cardiovascular Status Stable, Respiratory Function Stable, Patent Airway, No signs of Nausea or vomiting and No headache  Post-op Vital Signs: Reviewed and stable  Last Vitals:  Filed Vitals:   09/02/14 1217  BP:   Pulse:   Temp: 36.8 C  Resp:     Complications: No apparent anesthesia complications

## 2014-09-02 NOTE — Telephone Encounter (Signed)
Post op meds 

## 2014-09-02 NOTE — Anesthesia Procedure Notes (Signed)
Procedure Name: Intubation Date/Time: 09/02/2014 10:26 AM Performed by: Despina HiddenIDACAVAGE, Sharee Sturdy J Pre-anesthesia Checklist: Emergency Drugs available, Patient identified, Suction available and Patient being monitored Patient Re-evaluated:Patient Re-evaluated prior to inductionOxygen Delivery Method: Circle system utilized Preoxygenation: Pre-oxygenation with 100% oxygen Intubation Type: IV induction and Cricoid Pressure applied Ventilation: Mask ventilation without difficulty and Oral airway inserted - appropriate to patient size Laryngoscope Size: Mac and 3 Grade View: Grade II Tube type: Oral Tube size: 7.0 mm Number of attempts: 1 Airway Equipment and Method: Stylet and Oral airway Placement Confirmation: ETT inserted through vocal cords under direct vision,  positive ETCO2 and breath sounds checked- equal and bilateral Secured at: 22 cm Tube secured with: Tape Dental Injury: Teeth and Oropharynx as per pre-operative assessment

## 2014-09-02 NOTE — H&P (Signed)
TOC for chlamydia is negative Pt states she feel better  Has long history of dysmenorrhea and dysparunia, being managed on megace Has adenomyosis  [facility] the [facility] Will plan to proceed with making sure her chlamydia culture is negative  Preoperative History and Physical  Bonner PunaKristy N Jarecki is a 38 y.o. [phone] with Patient's last menstrual period was 07/23/2014. admitted for a TVH for long standing menorrhagia dysmenorrhea and dyspareunia.  Managed with megestrol, wants to proceed with definitive therapy  PMH:  Past Medical History  Diagnosis Date  . PIH (pregnancy induced hypertension)   . Endometriosis   . HPV (human papilloma virus) infection   . ADHD (attention deficit hyperactivity disorder)   . Depression   . Abnormal Pap smear   . Fibromyalgia 03/27/2013  . Anxiety 03/27/2013  . Breast lump 03/27/2013  . Migraine     PSH:  Past Surgical History  Procedure Laterality Date  . Dilation and curretage    . Dilation and curettage of uterus    . Hand surgery    . Tubal ligation  07/24/2011    Procedure: POST PARTUM TUBAL LIGATION; Surgeon: Catalina AntiguaPeggy Constant, MD; Location: [facility]; Service: Gynecology; Laterality: Bilateral;    POb/GynH:  OB History    Gravida Para Term Preterm AB TAB SAB Ectopic Multiple Living   5 3 3  0 2 0 2 0 0 3      SH:  History  Substance Use Topics  . Smoking status: Current Every Day Smoker -- 1.00 packs/day for 20 years    Types: Cigarettes  . Smokeless tobacco: Never Used  . Alcohol Use: Yes     Comment: occassional    FH:  Family History  Problem Relation Age of Onset  . Cancer Father     kidney   . Hypertension Father   . Heart disease Father   . Hyperlipidemia Father   . Other Brother     Factor 13 def.  . Clotting disorder Brother   .  Hypertension Other   . Hyperlipidemia Paternal Grandfather   . Heart disease Paternal Grandfather      Allergies: No Known Allergies  Medications: Current outpatient prescriptions: aspirin-acetaminophen-caffeine (EXCEDRIN MIGRAINE) 250-250-65 MG per tablet, Take by mouth every 6 (six) hours as needed for headache., Disp: , Rfl: ; clonazePAM (KLONOPIN) 1 MG tablet, Take 1 mg by mouth 2 (two) times daily., Disp: , Rfl: ; HYDROcodone-acetaminophen (NORCO) 10-325 MG per tablet, Take 1 tablet by mouth every 6 (six) hours as needed for pain., Disp: , Rfl:  megestrol (MEGACE) 40 MG tablet, 3 a day for 5 days, 2 a day for 5 days then 1 a day, Disp: 45 tablet, Rfl: 1; methocarbamol (ROBAXIN) 500 MG tablet, Take 500 mg by mouth 4 (four) times daily., Disp: , Rfl: ; pregabalin (LYRICA) 100 MG capsule, Take 100 mg by mouth at bedtime and may repeat dose one time if needed., Disp: , Rfl: ; topiramate (TOPAMAX) 50 MG tablet, Take 50 mg by mouth 2 (two) times daily., Disp: , Rfl:  ciprofloxacin (CIPRO) 500 MG tablet, Take 1 tablet (500 mg total) by mouth 2 (two) times daily., Disp: 20 tablet, Rfl: 0; fluconazole (DIFLUCAN) 150 MG tablet, 1 po stat; repeat in 3 days, Disp: 2 tablet, Rfl: 2; LORazepam (ATIVAN) 1 MG tablet, Take 1 mg by mouth every 8 (eight) hours., Disp: , Rfl: ; metroNIDAZOLE (FLAGYL) 500 MG tablet, Take 1 tablet (500 mg total) by mouth  2 (two) times daily. X 7 days, Disp: 14 tablet, Rfl: 0 Norethindrone-Ethinyl Estradiol-Fe Biphas (LO LOESTRIN FE) 1 MG-10 MCG / 10 MCG tablet, Take 1 tablet by mouth daily., Disp: 1 Package, Rfl: 11  Review of Systems:   Review of Systems  Constitutional: Negative for fever, chills, weight loss, malaise/fatigue and diaphoresis.  HENT: Negative for hearing loss, ear pain, nosebleeds, congestion, sore throat, neck pain, tinnitus and ear discharge.  Eyes: Negative for blurred vision, double vision, photophobia, pain, discharge and redness.   Respiratory: Negative for cough, hemoptysis, sputum production, shortness of breath, wheezing and stridor.  Cardiovascular: Negative for chest pain, palpitations, orthopnea, claudication, leg swelling and PND.  Gastrointestinal: Positive for abdominal pain. Negative for heartburn, nausea, vomiting, diarrhea, constipation, blood in stool and melena.  Genitourinary: Negative for dysuria, urgency, frequency, hematuria and flank pain.  Musculoskeletal: Negative for myalgias, back pain, joint pain and falls.  Skin: Negative for itching and rash.  Neurological: Negative for dizziness, tingling, tremors, sensory change, speech change, focal weakness, seizures, loss of consciousness, weakness and headaches.  Endo/Heme/Allergies: Negative for environmental allergies and polydipsia. Does not bruise/bleed easily.  Psychiatric/Behavioral: Negative for depression, suicidal ideas, hallucinations, memory loss and substance abuse. The patient is not nervous/anxious and does not have insomnia.     PHYSICAL EXAM:  Blood pressure 110/70, weight 133 lb (60.328 kg), last menstrual period 07/23/2014.   Vitals reviewed. Constitutional: She is oriented to person, place, and time. She appears well-developed and well-nourished.  HENT:  Head: Normocephalic and atraumatic.  Right Ear: External ear normal.  Left Ear: External ear normal.  Nose: Nose normal.  Mouth/Throat: Oropharynx is clear and moist.  Eyes: Conjunctivae and EOM are normal. Pupils are equal, round, and reactive to light. Right eye exhibits no discharge. Left eye exhibits no discharge. No scleral icterus.  Neck: Normal range of motion. Neck supple. No tracheal deviation present. No thyromegaly present.  Cardiovascular: Normal rate, regular rhythm, normal heart sounds and intact distal pulses. Exam reveals no gallop and no friction rub.  No murmur heard. Respiratory: Effort normal and breath sounds normal. No respiratory distress. She has  no wheezes. She has no rales. She exhibits no tenderness.  GI: Soft. Bowel sounds are normal. She exhibits no distension and no mass. There is tenderness. There is no rebound and no guarding.  Genitourinary:   Vulva is normal without lesions Vagina is pink moist without discharge Cervix normal in appearance and pap is normal Uterus is normal Adnexa is negative with normal sized ovaries by sonogram  Musculoskeletal: Normal range of motion. She exhibits no edema and no tenderness.  Neurological: She is alert and oriented to person, place, and time. She has normal reflexes. She displays normal reflexes. No cranial nerve deficit. She exhibits normal muscle tone. Coordination normal.  Skin: Skin is warm and dry. No rash noted. No erythema. No pallor.  Psychiatric: She has a normal mood and affect. Her behavior is normal. Judgment and thought content normal.    Labs: No results found for this or any previous visit (from the past 336 hour(s)).  EKG: No orders found for this or any previous visit.  Imaging Studies:  Imaging Results    US Transvaginal Non-ob  08/06/2014 GYNECOLOGIC SONOGRAM AMELI SANGIOVANNI is a 38 y.o. [id number] LMP 07/23/2014 for a pelvic sonogram for menorrhagia and pelvic pain. Uterus 8.1 x 5.8 x 4.4 cm, retroverted , posterior "streaky" shadows noted although no obvious myometrial mass noted within the uterus Endometrium 7.8 mm, symmetrical  Right ovary 3.6 x 1.7 x 1.6 cm, Left ovary 3.1 x 2.0 x 1.7 cm, No free fluid or adnexal masses noted within the pelvis Technician Comments: Retroverted uterus with posterior "streaky" shadows noted (?adenomyosis), Endom-7.848mm symmetrical, bilateral adnexa/ovaries appears WNL, no free fluid or adnexal masses noted within the pelvis Chari ManningMcBride, Tasha 08/06/2014 2:25 PM Clinical Impression and recommendations: I have reviewed the sonogram results above, combined with the  patient's current clinical course, below are my impressions and any appropriate recommendations for management based on the sonographic findings. Normal gynecologic sonogram, suggestion of adenomyosis Edana Aguado H 08/06/2014 3:21 PM       Assessment: dysmneorrhea Dyspareunia menorrhagia Patient Active Problem List   Diagnosis Date Noted  . Endometriosis 08/05/2014  . Pelvic pain in female 08/05/2014  . Menorrhagia with regular cycle 07/29/2014  . Fibromyalgia 03/27/2013  . Anxiety 03/27/2013  . Breast lump 03/27/2013    [facility]  Pt understands the risks of surgery including but not limited t excessive bleeding requiring transfusion or reoperation, post-operative infection requiring prolonged hospitalization or re-hospitalization and antibiotic therapy, and damage to other organs including bladder, bowel, ureters and major vessels. The patient also understands the alternative treatment options which were discussed in full. All questions were answered.  Reda Gettis H 09/02/2014 9:52 AM

## 2014-09-02 NOTE — Discharge Summary (Signed)
Physician Discharge Summary  Patient ID: Kathryn Golden MRN: [id number] DOB/AGE: [birth year] 36 y.o.  Admit date: 09/02/2014 Discharge date: 09/02/2014  Admission Diagnoses: S/p vag hyst Discharge Diagnoses:  Active Problems:   S/P vaginal hysterectomy   Discharged Condition: good  Hospital Course: unremarkable  Consults: None  Significant Diagnostic Studies:   Treatments: surgery: tvh   Discharge Exam: Blood pressure 114/70, pulse 71, temperature 98.1 F (36.7 C), temperature source Oral, resp. rate 18, height 5\' 8"  (1.727 m), weight 133 lb (60.328 kg), SpO2 99 %. GI: soft, non-tender; bowel sounds normal; no masses,  no organomegaly  Disposition: 01-Home or Self Care  Discharge Instructions    Call MD for:  persistant nausea and vomiting    Complete by:  As directed      Call MD for:  severe uncontrolled pain    Complete by:  As directed      Call MD for:  temperature >100.4    Complete by:  As directed      Diet - low sodium heart healthy    Complete by:  As directed      Driving Restrictions    Complete by:  As directed   About 1 week     Increase activity slowly    Complete by:  As directed      Lifting restrictions    Complete by:  As directed   Not more than 10 pounds     Sexual Activity Restrictions    Complete by:  As directed   Are you kidding?            Medication List    STOP taking these medications        fluconazole 150 MG tablet  Commonly known as:  DIFLUCAN     HYDROcodone-acetaminophen 10-325 MG per tablet  Commonly known as:  NORCO     megestrol 40 MG tablet  Commonly known as:  MEGACE     metroNIDAZOLE 500 MG tablet  Commonly known as:  FLAGYL      TAKE these medications        aspirin-acetaminophen-caffeine 250-250-65 MG per tablet  Commonly known as:  EXCEDRIN MIGRAINE  Take by mouth every 6 (six) hours as needed for headache.     ciprofloxacin 500 MG tablet  Commonly known as:  CIPRO  Take 1 tablet (500 mg total)  by mouth 2 (two) times daily.     clonazePAM 1 MG tablet  Commonly known as:  KLONOPIN  Take 1 mg by mouth 2 (two) times daily.     ketorolac 10 MG tablet  Commonly known as:  TORADOL  Take 1 tablet (10 mg total) by mouth every 8 (eight) hours as needed.     methocarbamol 500 MG tablet  Commonly known as:  ROBAXIN  Take 500 mg by mouth 4 (four) times daily.     ondansetron 8 MG tablet  Commonly known as:  ZOFRAN  Take 1 tablet (8 mg total) by mouth every 8 (eight) hours as needed for nausea.     oxyCODONE-acetaminophen 7.5-325 MG per tablet  Commonly known as:  PERCOCET  Take 1-2 tablets by mouth every 6 (six) hours as needed for pain.     pregabalin 100 MG capsule  Commonly known as:  LYRICA  Take 100 mg by mouth at bedtime and may repeat dose one time if needed.     topiramate 50 MG tablet  Commonly known as:  TOPAMAX  Take 50 mg by mouth  2 (two) times daily.           Follow-up Information    Follow up with Lazaro ArmsEURE,Koraima Albertsen H, MD In 1 week.   Specialties:  Obstetrics and Gynecology, Radiology   Contact information:   [address] [place] [address] [phone]       Signed: Lazaro ArmsURE,Mason Burleigh H 09/02/2014, 3:42 PM

## 2014-09-03 ENCOUNTER — Encounter (HOSPITAL_COMMUNITY): Payer: Self-pay | Admitting: Obstetrics & Gynecology

## 2014-09-03 ENCOUNTER — Telehealth: Payer: Self-pay | Admitting: *Deleted

## 2014-09-03 NOTE — Telephone Encounter (Signed)
Pt informed [facility] with [facility] in regards to percocet prescription Dr. Despina HiddenEure prescribed postoperatively, was informed that pt has a pain management contract with Dr. Johnathan Hausenuqua office and their was a risk if pt got percocet Rx filled without Dr. Johnathan Hausenuqua consent could be discharged from Dr. Tiburcio Peauqua's practice. Pt states she is aware of the contract and risk will call Dr. Johnathan Hausenuqua office and discuss with him.

## 2014-09-03 NOTE — Telephone Encounter (Signed)
Pt states had surgery yesterday with Dr. Despina HiddenEure in a lot of pain, rated at 8 on 1-10 scale, "pain all over." pt states Dr. Despina HiddenEure aware she is getting pain medication from another provider, Hydrocodone. Pt states this is not touching her pain.

## 2014-09-03 NOTE — Telephone Encounter (Signed)
discussed

## 2014-09-03 NOTE — Care Management Utilization Note (Signed)
UR completed- OIB 

## 2014-09-04 ENCOUNTER — Telehealth: Payer: Self-pay | Admitting: Obstetrics & Gynecology

## 2014-09-04 NOTE — Telephone Encounter (Signed)
Pt states having a lot of pain in legs, groin, and buttocks, no BM since Wednesday morning. Pt informed normal to have the pain she is have with the Hysterectomy Vaginal on 11/11/104 to continue pain medication and increase water intake and foods high in fiber. Call office back if no improvement. Pt verbalized understanding.

## 2014-09-10 ENCOUNTER — Encounter: Payer: Self-pay | Admitting: Obstetrics & Gynecology

## 2014-09-10 ENCOUNTER — Ambulatory Visit (INDEPENDENT_AMBULATORY_CARE_PROVIDER_SITE_OTHER): Payer: Medicaid Other | Admitting: Obstetrics & Gynecology

## 2014-09-10 VITALS — BP 118/68 | Ht 68.0 in | Wt 134.0 lb

## 2014-09-10 DIAGNOSIS — Z90710 Acquired absence of both cervix and uterus: Secondary | ICD-10-CM

## 2014-09-10 DIAGNOSIS — Z9889 Other specified postprocedural states: Secondary | ICD-10-CM

## 2014-09-10 NOTE — Progress Notes (Signed)
Patient ID: Kathryn Golden, female   DOB: Nov 10, 1975, 38 y.o.   MRN: [id number]  HPI: Patient returns for routine postoperative follow-up having undergone TVH on 09/02/2014. The patient's early postoperative recovery while in the hospital was notable for unremarkable, went home same day. Since hospital discharge the patient reports having BM, eating ok.   Current Outpatient Prescriptions  Medication Sig Dispense Refill  . aspirin-acetaminophen-caffeine (EXCEDRIN MIGRAINE) 250-250-65 MG per tablet Take by mouth every 6 (six) hours as needed for headache.    . ciprofloxacin (CIPRO) 500 MG tablet Take 1 tablet (500 mg total) by mouth 2 (two) times daily. 14 tablet 0  . clonazePAM (KLONOPIN) 1 MG tablet Take 1 mg by mouth 2 (two) times daily.    Marland Kitchen. ketorolac (TORADOL) 10 MG tablet Take 1 tablet (10 mg total) by mouth every 8 (eight) hours as needed. 15 tablet 0  . methocarbamol (ROBAXIN) 500 MG tablet Take 500 mg by mouth 4 (four) times daily.    . pregabalin (LYRICA) 100 MG capsule Take 100 mg by mouth at bedtime and may repeat dose one time if needed.    . topiramate (TOPAMAX) 50 MG tablet Take 50 mg by mouth 2 (two) times daily.    . ondansetron (ZOFRAN) 8 MG tablet Take 1 tablet (8 mg total) by mouth every 8 (eight) hours as needed for nausea. 24 tablet 1  . oxyCODONE-acetaminophen (PERCOCET) 7.5-325 MG per tablet Take 1-2 tablets by mouth every 6 (six) hours as needed for pain. 30 tablet 0   No current facility-administered medications for this visit.    Physical Exam: Vulva is normal without lesions Vagina is pink moist without discharge Cervix surgically absent Uterus is absent \\Adnexa  is negative with normal sized ovaries by sonogram ROS  Blood pressure 118/68, height 5\' 8"  (1.727 m), weight 134 lb (60.782 kg), last menstrual period 07/23/2014. Physical Exam  No results found for this or any previous visit (from the past 24 hour(s)).  No results found.  Assessment/Plan: 1 week  [facility]  Follow up 4 weeks  Jimmey Hengel H 09/10/2014, 10:58 AM    Diagnostic Tests: Pathology: benign

## 2014-10-08 ENCOUNTER — Encounter: Payer: Self-pay | Admitting: Obstetrics & Gynecology

## 2014-10-08 ENCOUNTER — Ambulatory Visit (INDEPENDENT_AMBULATORY_CARE_PROVIDER_SITE_OTHER): Payer: Medicaid Other | Admitting: Obstetrics & Gynecology

## 2014-10-08 VITALS — BP 120/70 | Wt 133.0 lb

## 2014-10-08 DIAGNOSIS — Z90710 Acquired absence of both cervix and uterus: Secondary | ICD-10-CM

## 2014-10-08 MED ORDER — METRONIDAZOLE 500 MG PO TABS: 500.0000 mg | ORAL_TABLET | Freq: Two times a day (BID) | ORAL | Status: DC

## 2014-10-08 NOTE — Progress Notes (Signed)
Patient ID: Kathryn Golden, female   DOB: September 05, 1976, 38 y.o.   MRN: [id number] Subjective:     Kathryn Golden is a 38 y.o. female who presents to the clinic 5 weeks status post vaginal hysterectomy for abnormal uterine bleeding and pelvic pain. Eating a regular diet without difficulty. Bowel movements are normal. The patient is not having any pain.  The following portions of the patient's history were reviewed and updated as appropriate: allergies, current medications, past family history, past medical history, past social history, past surgical history and problem list.  Review of Systems   No burning with urination, frequency or urgency No nausea, vomiting or diarrhea Nor fever chills or other constitutional symptoms    Objective:    BP 120/70 mmHg  Wt 133 lb (60.328 kg)  LMP 07/23/2014 General:  alert, cooperative and no distress  Abdomen: soft, bowel sounds active, non-tender  Incision:   healing well, no drainage, no erythema, no hernia, no seroma, no swelling, no dehiscence, incision well approximated     Assessment:    Doing well postoperatively. Operative findings again reviewed. Pathology report discussed.    Plan:    1. Continue any current medications. 2. Wound care discussed. 3. Activity restrictions: no sex for 2 more weeks 4. Anticipated return to work: not applicable. 5. Follow up: na na for suture removal.    Metronidazole 500 BID x 7 days

## 2014-12-12 ENCOUNTER — Emergency Department (HOSPITAL_COMMUNITY)
Admission: EM | Admit: 2014-12-12 | Discharge: 2014-12-12 | Disposition: A | Discharge status: Home / Self Care | Payer: Medicaid Other | Attending: Emergency Medicine | Admitting: Emergency Medicine

## 2014-12-12 ENCOUNTER — Encounter (HOSPITAL_COMMUNITY): Payer: Self-pay | Admitting: *Deleted

## 2014-12-12 DIAGNOSIS — Z862 Personal history of diseases of the blood and blood-forming organs and certain disorders involving the immune mechanism: Secondary | ICD-10-CM | POA: Diagnosis not present

## 2014-12-12 DIAGNOSIS — F329 Major depressive disorder, single episode, unspecified: Secondary | ICD-10-CM | POA: Diagnosis not present

## 2014-12-12 DIAGNOSIS — Z79899 Other long term (current) drug therapy: Secondary | ICD-10-CM | POA: Diagnosis not present

## 2014-12-12 DIAGNOSIS — Z792 Long term (current) use of antibiotics: Secondary | ICD-10-CM | POA: Diagnosis not present

## 2014-12-12 DIAGNOSIS — F419 Anxiety disorder, unspecified: Secondary | ICD-10-CM | POA: Diagnosis not present

## 2014-12-12 DIAGNOSIS — G43909 Migraine, unspecified, not intractable, without status migrainosus: Secondary | ICD-10-CM | POA: Insufficient documentation

## 2014-12-12 DIAGNOSIS — M797 Fibromyalgia: Secondary | ICD-10-CM | POA: Diagnosis not present

## 2014-12-12 DIAGNOSIS — M549 Dorsalgia, unspecified: Secondary | ICD-10-CM | POA: Diagnosis not present

## 2014-12-12 DIAGNOSIS — N898 Other specified noninflammatory disorders of vagina: Secondary | ICD-10-CM | POA: Insufficient documentation

## 2014-12-12 DIAGNOSIS — Z720 Tobacco use: Secondary | ICD-10-CM | POA: Diagnosis not present

## 2014-12-12 LAB — URINALYSIS, ROUTINE W REFLEX MICROSCOPIC
BILIRUBIN URINE: NEGATIVE
Glucose, UA: NEGATIVE mg/dL
HGB URINE DIPSTICK: NEGATIVE
Leukocytes, UA: NEGATIVE
Nitrite: NEGATIVE
PH: 6 (ref 5.0–8.0)
Protein, ur: NEGATIVE mg/dL
SPECIFIC GRAVITY, URINE: 1.01 (ref 1.005–1.030)
Urobilinogen, UA: 0.2 mg/dL (ref 0.0–1.0)

## 2014-12-12 LAB — WET PREP, GENITAL
Clue Cells Wet Prep HPF POC: NONE SEEN
Trich, Wet Prep: NONE SEEN
WBC WET PREP: NONE SEEN
YEAST WET PREP: NONE SEEN

## 2014-12-12 MED ORDER — LIDOCAINE HCL (PF) 1 % IJ SOLN
INTRAMUSCULAR | Status: AC
  Filled 2014-12-12: qty 5

## 2014-12-12 MED ORDER — AZITHROMYCIN 250 MG PO TABS
1000.0000 mg | ORAL_TABLET | Freq: Once | ORAL | Status: AC
  Administered 2014-12-12: 1000 mg via ORAL
  Filled 2014-12-12: qty 4

## 2014-12-12 MED ORDER — KETOROLAC TROMETHAMINE 60 MG/2ML IM SOLN
60.0000 mg | Freq: Once | INTRAMUSCULAR | Status: AC
  Administered 2014-12-12: 60 mg via INTRAMUSCULAR
  Filled 2014-12-12: qty 2

## 2014-12-12 MED ORDER — CEFTRIAXONE SODIUM 250 MG IJ SOLR
250.0000 mg | Freq: Once | INTRAMUSCULAR | Status: AC
  Administered 2014-12-12: 250 mg via INTRAMUSCULAR
  Filled 2014-12-12: qty 250

## 2014-12-12 NOTE — Discharge Instructions (Signed)
Back Pain, Adult °Low back pain is very common. About 1 in 5 people have back pain. The cause of low back pain is rarely dangerous. The pain often gets better over time. About half of people with a sudden onset of back pain feel better in just 2 weeks. About 8 in 10 people feel better by 6 weeks.  °CAUSES °Some common causes of back pain include: °· Strain of the muscles or ligaments supporting the spine. °· Wear and tear (degeneration) of the spinal discs. °· Arthritis. °· Direct injury to the back. °DIAGNOSIS °Most of the time, the direct cause of low back pain is not known. However, back pain can be treated effectively even when the exact cause of the pain is unknown. Answering your caregiver's questions about your overall health and symptoms is one of the most accurate ways to make sure the cause of your pain is not dangerous. If your caregiver needs more information, he or she may order lab work or imaging tests (X-rays or MRIs). However, even if imaging tests show changes in your back, this usually does not require surgery. °HOME CARE INSTRUCTIONS °For many people, back pain returns. Since low back pain is rarely dangerous, it is often a condition that people can learn to manage on their own.  °· Remain active. It is stressful on the back to sit or stand in one place. Do not sit, drive, or stand in one place for more than 30 minutes at a time. Take short walks on level surfaces as soon as pain allows. Try to increase the length of time you walk each day. °· Do not stay in bed. Resting more than 1 or 2 days can delay your recovery. °· Do not avoid exercise or work. Your body is made to move. It is not dangerous to be active, even though your back may hurt. Your back will likely heal faster if you return to being active before your pain is gone. °· Pay attention to your body when you  bend and lift. Many people have less discomfort when lifting if they bend their knees, keep the load close to their bodies, and  avoid twisting. Often, the most comfortable positions are those that put less stress on your recovering back. °· Find a comfortable position to sleep. Use a firm mattress and lie on your side with your knees slightly bent. If you lie on your back, put a pillow under your knees. °· Only take over-the-counter or prescription medicines as directed by your caregiver. Over-the-counter medicines to reduce pain and inflammation are often the most helpful. Your caregiver may prescribe muscle relaxant drugs. These medicines help dull your pain so you can more quickly return to your normal activities and healthy exercise. °· Put ice on the injured area. °¨ Put ice in a plastic bag. °¨ Place a towel between your skin and the bag. °¨ Leave the ice on for 15-20 minutes, 03-04 times a day for the first 2 to 3 days. After that, ice and heat may be alternated to reduce pain and spasms. °· Ask your caregiver about trying back exercises and gentle massage. This may be of some benefit. °· Avoid feeling anxious or stressed. Stress increases muscle tension and can worsen back pain. It is important to recognize when you are anxious or stressed and learn ways to manage it. Exercise is a great option. °SEEK MEDICAL CARE IF: °· You have pain that is not relieved with rest or medicine. °· You have pain that does not improve in 1 week. °· You have new symptoms. °· You are generally not feeling well. °SEEK   IMMEDIATE MEDICAL CARE IF:   You have pain that radiates from your back into your legs.  You develop new bowel or bladder control problems.  You have unusual weakness or numbness in your arms or legs.  You develop nausea or vomiting.  You develop abdominal pain.  You feel faint. Document Released: 10/09/2005 Document Revised: 04/09/2012 Document Reviewed: 02/10/2014 [facility] Patient Information 2015 [place], [place]. This information is not intended to replace advice given to you by your health care provider. Make sure you  discuss any questions you have with your health care provider. Safe Sex Safe sex is about reducing the risk of giving or getting a sexually transmitted disease (STD). STDs are spread through sexual contact involving the genitals, mouth, or rectum. Some STDs can be cured and others cannot. Safe sex can also prevent unintended pregnancies.  WHAT ARE SOME SAFE SEX PRACTICES?  Limit your sexual activity to only one partner who is having sex with only you.  Talk to your partner about his or her past partners, past STDs, and drug use.  Use a condom every time you have sexual intercourse. This includes vaginal, oral, and anal sexual activity. Both females and males should wear condoms during oral sex. Only use latex or polyurethane condoms and water-based lubricants. Using petroleum-based lubricants or oils to lubricate a condom will weaken the condom and increase the chance that it will break. The condom should be in place from the beginning to the end of sexual activity. Wearing a condom reduces, but does not completely eliminate, your risk of getting or giving an STD. STDs can be spread by contact with infected body fluids and skin.  Get vaccinated for hepatitis B and HPV.  Avoid alcohol and recreational drugs, which can affect your judgment. You may forget to use a condom or participate in high-risk sex.  For females, avoid douching after sexual intercourse. Douching can spread an infection farther into the reproductive tract.  Check your body for signs of sores, blisters, rashes, or unusual discharge. See your health care provider if you notice any of these signs.  Avoid sexual contact if you have symptoms of an infection or are being treated for an STD. If you or your partner has herpes, avoid sexual contact when blisters are present. Use condoms at all other times.  If you are at risk of being infected with HIV, it is recommended that you take a prescription medicine daily to prevent HIV  infection. This is called pre-exposure prophylaxis (PrEP). You are considered at risk if:  You are a man who has sex with other men (MSM).  You are a heterosexual man or woman who is sexually active with more than one partner.  You take drugs by injection.  You are sexually active with a partner who has HIV.  Talk with your health care provider about whether you are at high risk of being infected with HIV. If you choose to begin PrEP, you should first be tested for HIV. You should then be tested every 3 months for as long as you are taking PrEP.  See your health care provider for regular screenings, exams, and tests for other STDs. Before having sex with a new partner, each of you should be screened for STDs and should talk about the results with each other. WHAT ARE THE BENEFITS OF SAFE SEX?   There is less chance of getting or giving an STD.  You can prevent unwanted or unintended pregnancies.  By discussing safe sex concerns  with your partner, you may increase feelings of intimacy, comfort, trust, and honesty between the two of you. Document Released: 11/16/2004 Document Revised: 02/23/2014 Document Reviewed: 04/01/2012 [facility] Patient Information 2015 [place], [place]. This information is not intended to replace advice given to you by your health care provider. Make sure you discuss any questions you have with your health care provider.

## 2014-12-12 NOTE — ED Provider Notes (Signed)
CSN: [id number]     Arrival date & time 12/12/14  1852 History   First MD Initiated Contact with Patient 12/12/14 1917     Chief Complaint  Patient presents with  . Back Pain     (Consider location/radiation/quality/duration/timing/severity/associated sxs/prior Treatment) Patient is a 39 y.o. female presenting with back pain. The history is provided by the patient. No language interpreter was used.  Back Pain Location:  Generalized Quality:  Aching Radiates to:  Does not radiate Pain severity:  No pain Pain is:  Worse during the day Timing:  Constant Progression:  Worsening Chronicity:  New Relieved by:  Nothing Worsened by:  NSAIDs Ineffective treatments:  None tried Associated symptoms: abdominal pain   Pt complains of pain in her right back.   Pt reports the last time she had pain like this she had chlamydia  Past Medical History  Diagnosis Date  . Endometriosis   . HPV (human papilloma virus) infection   . ADHD (attention deficit hyperactivity disorder)   . Depression   . Abnormal Pap smear   . Fibromyalgia 03/27/2013  . Anxiety 03/27/2013  . Breast lump 03/27/2013  . Migraine   . PIH (pregnancy induced hypertension)     in pregnancy only  . Anemia    Past Surgical History  Procedure Laterality Date  . Dilation and curretage    . Dilation and curettage of uterus    . Hand surgery Right     tendon repair  . Tubal ligation  07/24/2011    Procedure: POST PARTUM TUBAL LIGATION;  Surgeon: Catalina Antigua, MD;  Location: [facility];  Service: Gynecology;  Laterality: Bilateral;  . Vaginal hysterectomy N/A 09/02/2014    Procedure: HYSTERECTOMY VAGINAL;  Surgeon: Lazaro Arms, MD;  Location: [facility];  Service: Gynecology;  Laterality: N/A;   Family History  Problem Relation Age of Onset  . Cancer Father     kidney   . Hypertension Father   . Heart disease Father   . Hyperlipidemia Father   . Other Brother     Factor 13 def.  . Clotting disorder Brother   . Hypertension  Other   . Hyperlipidemia Paternal Grandfather   . Heart disease Paternal Grandfather    History  Substance Use Topics  . Smoking status: Current Every Day Smoker -- 1.00 packs/day for 20 years    Types: Cigarettes  . Smokeless tobacco: Never Used  . Alcohol Use: Yes     Comment: occassional   OB History    Gravida Para Term Preterm AB TAB SAB Ectopic Multiple Living   0 2 0 2 0 0 3     Review of Systems  Gastrointestinal: Positive for abdominal pain.  Musculoskeletal: Positive for back pain.  All other systems reviewed and are negative.     Allergies  Review of patient's allergies indicates no known allergies.  Home Medications   Prior to Admission medications   Medication Sig Start Date End Date Taking? Authorizing Provider  aspirin-acetaminophen-caffeine (EXCEDRIN MIGRAINE) [phone] MG per tablet Take by mouth every 6 (six) hours as needed for headache.    Historical Provider, MD  ciprofloxacin (CIPRO) 500 MG tablet Take 1 tablet (500 mg total) by mouth 2 (two) times daily. Patient not taking: Reported on 10/08/2014 09/02/14   Lazaro Arms, MD  clonazePAM (KLONOPIN) 1 MG tablet Take 1 mg by mouth 2 (two) times daily.    Historical Provider, MD  ketorolac (TORADOL) 10 MG tablet Take 1  tablet (10 mg total) by mouth every 8 (eight) hours as needed. Patient not taking: Reported on 10/08/2014 09/02/14   Lazaro ArmsLuther H Eure, MD  methocarbamol (ROBAXIN) 500 MG tablet Take 500 mg by mouth 4 (four) times daily.    Historical Provider, MD  metroNIDAZOLE (FLAGYL) 500 MG tablet Take 1 tablet (500 mg total) by mouth 2 (two) times daily. 10/08/14   Lazaro ArmsLuther H Eure, MD  ondansetron (ZOFRAN) 8 MG tablet Take 1 tablet (8 mg total) by mouth every 8 (eight) hours as needed for nausea. Patient not taking: Reported on 10/08/2014 09/02/14   Lazaro ArmsLuther H Eure, MD  oxyCODONE-acetaminophen (PERCOCET) 7.5-325 MG per tablet Take 1-2 tablets by mouth every 6 (six) hours as needed for pain. 09/02/14    Lazaro ArmsLuther H Eure, MD  pregabalin (LYRICA) 100 MG capsule Take 100 mg by mouth at bedtime and may repeat dose one time if needed.    Historical Provider, MD  topiramate (TOPAMAX) 50 MG tablet Take 50 mg by mouth 2 (two) times daily.    Historical Provider, MD   BP 120/89 mmHg  Pulse 119  Temp(Src) 98.3 F (36.8 C) (Oral)  Resp 18  Ht 5\' 8"  (1.727 m)  Wt 135 lb (61.236 kg)  BMI 20.53 kg/m2  SpO2 99%  LMP 07/23/2014 Physical Exam  Constitutional: She is oriented to person, place, and time. She appears well-developed and well-nourished.  HENT:  Head: Normocephalic and atraumatic.  Eyes: Conjunctivae and EOM are normal. Pupils are equal, round, and reactive to light.  Neck: Normal range of motion. Neck supple.  Cardiovascular: Normal rate.   Pulmonary/Chest: Effort normal.  Abdominal: Soft. She exhibits no distension. There is no tenderness.  Genitourinary: Vaginal discharge found.  Vaginal discharge,  Thick white,  Adnexa no masses,    Musculoskeletal: Normal range of motion.  Neurological: She is alert and oriented to person, place, and time.  Skin: Skin is warm.  Psychiatric: She has a normal mood and affect.  Nursing note and vitals reviewed.   ED Course  Procedures (including critical care time) Labs Review Labs Reviewed - No data to display  Imaging Review No results found.   EKG Interpretation None      MDM  Au and wet prep negative   Pt given torodol Im for pain   Final diagnoses:  Back pain, unspecified location    Rocephin and zithromax here.      Elson AreasLeslie K Sofia, PA-C 12/12/14 2041  Benny LennertJoseph L Zammit, MD 12/12/14 2118

## 2014-12-12 NOTE — ED Notes (Signed)
Pt c/o lower back pain

## 2014-12-14 LAB — GC/CHLAMYDIA PROBE AMP (~~LOC~~) NOT AT ARMC
Chlamydia: NEGATIVE
NEISSERIA GONORRHEA: NEGATIVE

## 2014-12-17 ENCOUNTER — Other Ambulatory Visit: Payer: Self-pay | Admitting: Neurology

## 2014-12-17 DIAGNOSIS — R200 Anesthesia of skin: Secondary | ICD-10-CM

## 2014-12-17 DIAGNOSIS — R531 Weakness: Secondary | ICD-10-CM

## 2014-12-23 ENCOUNTER — Ambulatory Visit (HOSPITAL_COMMUNITY): Payer: Medicaid Other

## 2014-12-25 ENCOUNTER — Other Ambulatory Visit: Payer: Self-pay | Admitting: Neurology

## 2014-12-25 ENCOUNTER — Ambulatory Visit (HOSPITAL_COMMUNITY)
Admission: RE | Admit: 2014-12-25 | Discharge: 2014-12-25 | Disposition: A | Discharge status: Home / Self Care | Payer: Medicaid Other | Source: Ambulatory Visit | Attending: Neurology | Admitting: Neurology

## 2014-12-25 DIAGNOSIS — M5440 Lumbago with sciatica, unspecified side: Secondary | ICD-10-CM

## 2015-01-27 ENCOUNTER — Ambulatory Visit (HOSPITAL_COMMUNITY): Payer: Medicaid Other

## 2015-04-12 ENCOUNTER — Encounter (HOSPITAL_COMMUNITY): Payer: Self-pay | Admitting: *Deleted

## 2015-04-12 DIAGNOSIS — Z792 Long term (current) use of antibiotics: Secondary | ICD-10-CM | POA: Diagnosis not present

## 2015-04-12 DIAGNOSIS — Z79899 Other long term (current) drug therapy: Secondary | ICD-10-CM | POA: Insufficient documentation

## 2015-04-12 DIAGNOSIS — Z8619 Personal history of other infectious and parasitic diseases: Secondary | ICD-10-CM | POA: Diagnosis not present

## 2015-04-12 DIAGNOSIS — R51 Headache: Secondary | ICD-10-CM | POA: Diagnosis present

## 2015-04-12 DIAGNOSIS — F329 Major depressive disorder, single episode, unspecified: Secondary | ICD-10-CM | POA: Insufficient documentation

## 2015-04-12 DIAGNOSIS — R200 Anesthesia of skin: Secondary | ICD-10-CM | POA: Diagnosis not present

## 2015-04-12 DIAGNOSIS — Z720 Tobacco use: Secondary | ICD-10-CM | POA: Insufficient documentation

## 2015-04-12 DIAGNOSIS — Z8742 Personal history of other diseases of the female genital tract: Secondary | ICD-10-CM | POA: Insufficient documentation

## 2015-04-12 DIAGNOSIS — R202 Paresthesia of skin: Secondary | ICD-10-CM | POA: Diagnosis not present

## 2015-04-12 DIAGNOSIS — M549 Dorsalgia, unspecified: Secondary | ICD-10-CM | POA: Insufficient documentation

## 2015-04-12 DIAGNOSIS — F419 Anxiety disorder, unspecified: Secondary | ICD-10-CM | POA: Diagnosis not present

## 2015-04-12 DIAGNOSIS — Z862 Personal history of diseases of the blood and blood-forming organs and certain disorders involving the immune mechanism: Secondary | ICD-10-CM | POA: Diagnosis not present

## 2015-04-12 DIAGNOSIS — M797 Fibromyalgia: Secondary | ICD-10-CM | POA: Insufficient documentation

## 2015-04-12 NOTE — ED Notes (Signed)
Intermittent numbness of lt side of body, Had headache, last night, nausea.  Low back pain

## 2015-04-13 ENCOUNTER — Emergency Department (HOSPITAL_COMMUNITY)
Admission: EM | Admit: 2015-04-13 | Discharge: 2015-04-13 | Disposition: A | Discharge status: Home / Self Care | Payer: Medicaid Other | Attending: Emergency Medicine | Admitting: Emergency Medicine

## 2015-04-13 DIAGNOSIS — R202 Paresthesia of skin: Secondary | ICD-10-CM

## 2015-04-13 DIAGNOSIS — R51 Headache: Secondary | ICD-10-CM

## 2015-04-13 DIAGNOSIS — R519 Headache, unspecified: Secondary | ICD-10-CM

## 2015-04-13 MED ORDER — SODIUM CHLORIDE 0.9 % IV BOLUS (SEPSIS)
1000.0000 mL | Freq: Once | INTRAVENOUS | Status: AC
  Administered 2015-04-13: 1000 mL via INTRAVENOUS

## 2015-04-13 MED ORDER — PROCHLORPERAZINE EDISYLATE 5 MG/ML IJ SOLN
10.0000 mg | Freq: Once | INTRAMUSCULAR | Status: AC
  Administered 2015-04-13: 10 mg via INTRAVENOUS
  Filled 2015-04-13: qty 2

## 2015-04-13 MED ORDER — DIPHENHYDRAMINE HCL 50 MG/ML IJ SOLN
25.0000 mg | Freq: Once | INTRAMUSCULAR | Status: AC
  Administered 2015-04-13: 25 mg via INTRAVENOUS
  Filled 2015-04-13: qty 1

## 2015-04-13 NOTE — Discharge Instructions (Signed)
You were seen today for paresthesias of the right side. This is been ongoing. Your family history is notable for MS. While there does not appear to be an emergent need for MRI, you should follow-up with her primary physician to arrange MRIs an outpatient.  Paresthesia Paresthesia is an abnormal burning or prickling sensation. This sensation is generally felt in the hands, arms, legs, or feet. However, it may occur in any part of the body. It is usually not painful. The feeling may be described as:  Tingling or numbness.  "Pins and needles."  Skin crawling.  Buzzing.  Limbs "falling asleep."  Itching. Most people experience temporary (transient) paresthesia at some time in their lives. CAUSES  Paresthesia may occur when you breathe too quickly (hyperventilation). It can also occur without any apparent cause. Commonly, paresthesia occurs when pressure is placed on a nerve. The feeling quickly goes away once the pressure is removed. For some people, however, paresthesia is a long-lasting (chronic) condition caused by an underlying disorder. The underlying disorder may be:  A traumatic, direct injury to nerves. Examples include a:  Broken (fractured) neck.  Fractured skull.  A disorder affecting the brain and spinal cord (central nervous system). Examples include:  Transverse myelitis.  Encephalitis.  Transient ischemic attack.  Multiple sclerosis.  Stroke.  Tumor or blood vessel problems, such as an arteriovenous malformation pressing against the brain or spinal cord.  A condition that damages the peripheral nerves (peripheral neuropathy). Peripheral nerves are not part of the brain and spinal cord. These conditions include:  Diabetes.  Peripheral vascular disease.  Nerve entrapment syndromes, such as carpal tunnel syndrome.  Shingles.  Hypothyroidism.  Vitamin B12 deficiencies.  Alcoholism.  Heavy metal poisoning (lead, arsenic).  Rheumatoid  arthritis.  Systemic lupus erythematosus. DIAGNOSIS  Your caregiver will attempt to find the underlying cause of your paresthesia. Your caregiver may:  Take your medical history.  Perform a physical exam.  Order various lab tests.  Order imaging tests. TREATMENT  Treatment for paresthesia depends on the underlying cause. HOME CARE INSTRUCTIONS  Avoid drinking alcohol.  You may consider massage or acupuncture to help relieve your symptoms.  Keep all follow-up appointments as directed by your caregiver. SEEK IMMEDIATE MEDICAL CARE IF:   You feel weak.  You have trouble walking or moving.  You have problems with speech or vision.  You feel confused.  You cannot control your bladder or bowel movements.  You feel numbness after an injury.  You faint.  Your burning or prickling feeling gets worse when walking.  You have pain, cramps, or dizziness.  You develop a rash. MAKE SURE YOU:  Understand these instructions.  Will watch your condition.  Will get help right away if you are not doing well or get worse. Document Released: 09/29/2002 Document Revised: 01/01/2012 Document Reviewed: 06/30/2011 [facility] Patient Information 2015 [place], [place]. This information is not intended to replace advice given to you by your health care provider. Make sure you discuss any questions you have with your health care provider.

## 2015-04-13 NOTE — ED Provider Notes (Signed)
CSN: [id number]     Arrival date & time 04/12/15  2158 History  This chart was scribed for Shon Baton, MD by Evon Slack, ED Scribe. This patient was seen in room APA07/APA07 and the patient's care was started at 12:36 AM.      No chief complaint on file.  The history is provided by the patient. No language interpreter was used.   HPI Comments: Kathryn Golden is a 39 y.o. female with medical HX listed below who presents to the Emergency Department complaining of worsening intermittnet right sided numbness in her right arm and leg onset 1 month prior. Pt describes the pain as a heaviness and tingling. Pt states that the numbness intermittently radiates to her left side. Pt rates the severity of her pain 8/10. Pt states that she is also having intermittent back pain. Pt doesn't report any medications tried PTA. Patient also reports headache with nausea. Pt denies any injury or trauma. Pt denies any new vision changes or weakness.  Patient denies any difficulty with her bowel or bladder. Family history notable for MS. Patient is concerned for this. She has not seen her primary physician for these complaints.     Past Medical History  Diagnosis Date  . Endometriosis   . HPV (human papilloma virus) infection   . ADHD (attention deficit hyperactivity disorder)   . Depression   . Abnormal Pap smear   . Fibromyalgia 03/27/2013  . Anxiety 03/27/2013  . Breast lump 03/27/2013  . Migraine   . PIH (pregnancy induced hypertension)     in pregnancy only  . Anemia    Past Surgical History  Procedure Laterality Date  . Dilation and curretage    . Dilation and curettage of uterus    . Hand surgery Right     tendon repair  . Tubal ligation  07/24/2011    Procedure: POST PARTUM TUBAL LIGATION;  Surgeon: Catalina Antigua, MD;  Location: [facility];  Service: Gynecology;  Laterality: Bilateral;  . Vaginal hysterectomy N/A 09/02/2014    Procedure: HYSTERECTOMY VAGINAL;  Surgeon: Lazaro Arms, MD;   Location: [facility];  Service: Gynecology;  Laterality: N/A;  . Abdominal hysterectomy     Family History  Problem Relation Age of Onset  . Cancer Father     kidney   . Hypertension Father   . Heart disease Father   . Hyperlipidemia Father   . Other Brother     Factor 13 def.  . Clotting disorder Brother   . Hypertension Other   . Hyperlipidemia Paternal Grandfather   . Heart disease Paternal Grandfather    History  Substance Use Topics  . Smoking status: Current Every Day Smoker -- 1.00 packs/day for 20 years    Types: Cigarettes  . Smokeless tobacco: Never Used  . Alcohol Use: Yes     Comment: occassional   OB History    Gravida Para Term Preterm AB TAB SAB Ectopic Multiple Living   5 3 3  0 2 0 2 0 0 3     Review of Systems  Constitutional: Negative for fever.  Eyes: Negative for visual disturbance.  Respiratory: Negative for cough, chest tightness and shortness of breath.   Cardiovascular: Negative for chest pain.  Gastrointestinal: Negative for nausea, vomiting and abdominal pain.  Genitourinary: Negative for dysuria and difficulty urinating.  Musculoskeletal: Positive for back pain.  Skin: Negative for wound.  Neurological: Positive for numbness and headaches. Negative for dizziness and weakness.  Psychiatric/Behavioral: Negative for  confusion.  All other systems reviewed and are negative.     Allergies  Review of patient's allergies indicates no known allergies.  Home Medications   Prior to Admission medications   Medication Sig Start Date End Date Taking? Authorizing Provider  aspirin-acetaminophen-caffeine (EXCEDRIN MIGRAINE) [phone] MG per tablet Take by mouth every 6 (six) hours as needed for headache.    Historical Provider, MD  clonazePAM (KLONOPIN) 1 MG tablet Take 1 mg by mouth daily as needed for anxiety.     Historical Provider, MD  HYDROcodone-acetaminophen (NORCO) 10-325 MG per tablet Take 1 tablet by mouth 3 (three) times daily as needed for  moderate pain.    Historical Provider, MD  ketorolac (TORADOL) 10 MG tablet Take 1 tablet (10 mg total) by mouth every 8 (eight) hours as needed. Patient not taking: Reported on 10/08/2014 09/02/14   Lazaro Arms, MD  methocarbamol (ROBAXIN) 500 MG tablet Take 500 mg by mouth daily as needed for muscle spasms.     Historical Provider, MD  metroNIDAZOLE (FLAGYL) 500 MG tablet Take 1 tablet (500 mg total) by mouth 2 (two) times daily. Patient not taking: Reported on 12/12/2014 10/08/14   Lazaro Arms, MD  ondansetron (ZOFRAN) 8 MG tablet Take 1 tablet (8 mg total) by mouth every 8 (eight) hours as needed for nausea. Patient not taking: Reported on 10/08/2014 09/02/14   Lazaro Arms, MD  oxyCODONE-acetaminophen (PERCOCET) 7.5-325 MG per tablet Take 1-2 tablets by mouth every 6 (six) hours as needed for pain. Patient not taking: Reported on 12/12/2014 09/02/14   Lazaro Arms, MD  pregabalin (LYRICA) 150 MG capsule Take 150 mg by mouth 2 (two) times daily.    Historical Provider, MD  topiramate (TOPAMAX) 50 MG tablet Take 50 mg by mouth daily as needed.     Historical Provider, MD   BP 94/57 mmHg  Pulse 78  Temp(Src) 98.2 F (36.8 C) (Oral)  Resp 20  Ht  (1.753 m)  Wt 138 lb (62.596 kg)  BMI 20.37 kg/m2  SpO2 100%  LMP 07/23/2014   Physical Exam  Constitutional: She is oriented to person, place, and time. She appears well-developed and well-nourished. No distress.  HENT:  Head: Normocephalic and atraumatic.  Eyes: EOM are normal.  Cardiovascular: Normal rate, regular rhythm and normal heart sounds.   No murmur heard. Pulmonary/Chest: Effort normal and breath sounds normal. No respiratory distress. She has no wheezes.  Abdominal: Soft. Bowel sounds are normal. There is no tenderness. There is no rebound.  Neurological: She is alert and oriented to person, place, and time.  5 out of 5 strength in all 4 extremities, no dysmetria to finger-nose-finger, normal symmetric reflexes  bilateral lower extremities  Skin: Skin is warm and dry.  Psychiatric: She has a normal mood and affect.  Nursing note and vitals reviewed.   ED Course  Procedures (including critical care time) DIAGNOSTIC STUDIES: Oxygen Saturation is 100% on RA, normal by my interpretation.    COORDINATION OF CARE: 12:43 AM-Discussed treatment plan with pt at bedside and pt agreed to plan.     Labs Review Labs Reviewed - No data to display  Imaging Review No results found.   EKG Interpretation None      MDM   Final diagnoses:  Nonintractable headache, unspecified chronicity pattern, unspecified headache type  Paresthesia    Patient presents with headache, paresthesias, and low back pain. Symptoms have been ongoing for the last month. Nontoxic on exam. Nonfocal. Patient  has a family history of MS and is concerned for this. No current vision changes. Patient treated for her headache.  Discuss with patient that I do not have MRI availability tonight. Right now her exam is reassuring; however, MS can manifest in waxing and waning neurologic symptoms including paresthesias. She has no vision changes right now and do not feel she needs an emergent MRI; however, I have offered her transfer to [facility]. Patient declined transfer. She will follow-up with her primary physician. Regarding her back pain, this appears chronic in nature. Patient reports history of fibromyalgia. No signs or symptoms of cauda equina. Patient has been resting comfortably after a migraine cocktail. She is to follow-up with her primary physician for outpatient MRI.  After history, exam, and medical workup I feel the patient has been appropriately medically screened and is safe for discharge home. Pertinent diagnoses were discussed with the patient. Patient was given return precautions.  I personally performed the services described in this documentation, which was scribed in my presence. The recorded information has been  reviewed and is accurate.      Shon Baton, MD 04/13/15 [phone]

## 2015-05-24 ENCOUNTER — Ambulatory Visit (HOSPITAL_COMMUNITY)
Admission: RE | Admit: 2015-05-24 | Discharge: 2015-05-24 | Disposition: A | Discharge status: Home / Self Care | Payer: Medicaid Other | Source: Ambulatory Visit | Attending: Neurology | Admitting: Neurology

## 2015-05-24 DIAGNOSIS — R200 Anesthesia of skin: Secondary | ICD-10-CM | POA: Insufficient documentation

## 2015-05-24 DIAGNOSIS — R531 Weakness: Secondary | ICD-10-CM | POA: Diagnosis not present

## 2015-05-24 DIAGNOSIS — R51 Headache: Secondary | ICD-10-CM | POA: Diagnosis not present

## 2015-10-04 ENCOUNTER — Other Ambulatory Visit: Payer: Self-pay | Admitting: Neurology

## 2015-10-04 DIAGNOSIS — M5412 Radiculopathy, cervical region: Secondary | ICD-10-CM

## 2015-10-20 ENCOUNTER — Ambulatory Visit (HOSPITAL_COMMUNITY)
Admission: RE | Admit: 2015-10-20 | Discharge: 2015-10-20 | Disposition: A | Discharge status: Home / Self Care | Payer: Medicaid Other | Source: Ambulatory Visit | Attending: Neurology | Admitting: Neurology

## 2015-10-20 DIAGNOSIS — R200 Anesthesia of skin: Secondary | ICD-10-CM | POA: Insufficient documentation

## 2015-10-20 DIAGNOSIS — M2578 Osteophyte, vertebrae: Secondary | ICD-10-CM | POA: Insufficient documentation

## 2015-10-20 DIAGNOSIS — M50221 Other cervical disc displacement at C4-C5 level: Secondary | ICD-10-CM | POA: Diagnosis not present

## 2015-10-20 DIAGNOSIS — M5412 Radiculopathy, cervical region: Secondary | ICD-10-CM

## 2017-04-20 ENCOUNTER — Ambulatory Visit (INDEPENDENT_AMBULATORY_CARE_PROVIDER_SITE_OTHER): Payer: Medicaid Other | Admitting: Obstetrics & Gynecology

## 2017-04-20 ENCOUNTER — Encounter: Payer: Self-pay | Admitting: Obstetrics & Gynecology

## 2017-04-20 VITALS — BP 118/70 | HR 95 | Wt 138.0 lb

## 2017-04-20 DIAGNOSIS — B9689 Other specified bacterial agents as the cause of diseases classified elsewhere: Secondary | ICD-10-CM | POA: Diagnosis not present

## 2017-04-20 DIAGNOSIS — Z113 Encounter for screening for infections with a predominantly sexual mode of transmission: Secondary | ICD-10-CM | POA: Diagnosis not present

## 2017-04-20 DIAGNOSIS — N760 Acute vaginitis: Secondary | ICD-10-CM

## 2017-04-20 MED ORDER — METRONIDAZOLE 500 MG PO TABS: 500.0000 mg | ORAL_TABLET | Freq: Two times a day (BID) | ORAL | 0 refills | Status: DC

## 2017-04-20 NOTE — Progress Notes (Signed)
Chief Complaint  Patient presents with  . STD screening    Blood pressure 118/70, pulse 95, weight 138 lb (62.6 kg), last menstrual period 07/23/2014.  40 y.o. [id number] Patient's last menstrual period was 07/23/2014. The current method of family planning is status post hysterectomy.  Subjective Vaginal discharge for 1days Itching yes Irritation yes Odor yes Similar to previous yes  Previous treatment yes years ago  Objective Vulva:  normal appearing vulva with no masses, tenderness or lesions Vagina:  normal mucosa, thin grey discharge Cervix:  absent Uterus:  uterus absent Adnexa: ovaries:present,  normal adnexa in size, nontender and no masses     Pertinent ROS Today patient onfirms the following: No burning with urination, frequency or urgency No nausea, vomiting or diarrhea Nor fever chills or other constitutional symptoms   Labs or studies Wet Prep:   A sample of vaginal discharge was obtained from the posterior fornix using a cotton swab. 2 drops of saline were placed on a slide and the cotton swab was immersed in the saline. Microscopic evaluation was performed and results were as follows:  Negative   for yeast  Positive for clue cells , consistent with Bacterial vaginosis Negative for trichomonas  Normal WBC population   Whiff test: Positive     Impression Diagnoses this Encounter::   ICD-10-CM   1. BV (bacterial vaginosis) N76.0    B96.89   2. Screening for STD (sexually transmitted disease) Z11.3 GC/Chlamydia Probe Amp    Established relevant diagnosis(es):   Plan/Recommendations: Meds ordered this encounter  Medications  . DULoxetine (CYMBALTA) 20 MG capsule    Sig: Take 20 mg by mouth daily.  Marland Kitchen rOPINIRole (REQUIP) 0.5 MG tablet    Sig: Take 0.5 mg by mouth at bedtime.  . metroNIDAZOLE (FLAGYL) 500 MG tablet    Sig: Take 1 tablet (500 mg total) by mouth 2 (two) times daily.    Dispense:  14 tablet    Refill:  0    Labs or  Scans Ordered: Orders Placed This Encounter  Procedures  . GC/Chlamydia Probe Amp    Management:: Metronidazole 500 mg twice a day for 7 days Per patient request GC and chlamydia are pending Patient states she always get a yeast infection when she is on antibiotic she is encouraged use over-the-counter medications and side is more price effective  Follow up Return if symptoms worsen or fail to improve.         All questions were answered.  Past Medical History:  Diagnosis Date  . Abnormal Pap smear   . ADHD (attention deficit hyperactivity disorder)   . Anemia   . Anxiety 03/27/2013  . Breast lump 03/27/2013  . Degenerative cervical disc   . Depression   . Endometriosis   . Fibromyalgia 03/27/2013  . HPV (human papilloma virus) infection   . Migraine   . PIH (pregnancy induced hypertension)    in pregnancy only    Past Surgical History:  Procedure Laterality Date  . ABDOMINAL HYSTERECTOMY    . DILATION AND CURETTAGE OF UTERUS    . dilation and curretage    . HAND SURGERY Right    tendon repair  . TUBAL LIGATION  07/24/2011   Procedure: POST PARTUM TUBAL LIGATION;  Surgeon: Catalina Antigua, MD;  Location: [facility];  Service: Gynecology;  Laterality: Bilateral;  . VAGINAL HYSTERECTOMY N/A 09/02/2014   Procedure: HYSTERECTOMY VAGINAL;  Surgeon: Lazaro Arms, MD;  Location: [facility];  Service: Gynecology;  Laterality: N/A;    OB History    Gravida Para Term Preterm AB Living   5 3 3  0 2 3   SAB TAB Ectopic Multiple Live Births   2 0 0 0 1      No Known Allergies  Social History   Social History  . Marital status: Divorced    Spouse name: N/A  . Number of children: N/A  . Years of education: N/A   Social History Main Topics  . Smoking status: Current Every Day Smoker    Packs/day: 1.00    Years: 20.00    Types: Cigarettes  . Smokeless tobacco: Never Used  . Alcohol use Yes     Comment: occassional  . Drug use: No  . Sexual activity: Yes    Birth  control/ protection: Surgical   Other Topics Concern  . None   Social History Narrative  . None    Family History  Problem Relation Age of Onset  . Cancer Father        kidney   . Hypertension Father   . Heart disease Father   . Hyperlipidemia Father   . Other Brother        Factor 13 def.  . Clotting disorder Brother   . Hyperlipidemia Paternal Grandfather   . Heart disease Paternal Grandfather   . Hypertension Other

## 2017-04-22 LAB — GC/CHLAMYDIA PROBE AMP
CHLAMYDIA, DNA PROBE: NEGATIVE
Neisseria gonorrhoeae by PCR: NEGATIVE

## 2017-06-05 ENCOUNTER — Encounter (HOSPITAL_COMMUNITY): Payer: Self-pay | Admitting: *Deleted

## 2017-06-05 ENCOUNTER — Emergency Department (HOSPITAL_COMMUNITY)
Admission: EM | Admit: 2017-06-05 | Discharge: 2017-06-05 | Disposition: A | Discharge status: Home / Self Care | Payer: Medicaid Other | Attending: Emergency Medicine | Admitting: Emergency Medicine

## 2017-06-05 DIAGNOSIS — M545 Low back pain, unspecified: Secondary | ICD-10-CM

## 2017-06-05 DIAGNOSIS — F17210 Nicotine dependence, cigarettes, uncomplicated: Secondary | ICD-10-CM | POA: Diagnosis not present

## 2017-06-05 DIAGNOSIS — Z79899 Other long term (current) drug therapy: Secondary | ICD-10-CM | POA: Insufficient documentation

## 2017-06-05 HISTORY — DX: Personal history of other diseases of the musculoskeletal system and connective tissue: Z87.39

## 2017-06-05 MED ORDER — KETOROLAC TROMETHAMINE 60 MG/2ML IM SOLN
60.0000 mg | Freq: Once | INTRAMUSCULAR | Status: AC
  Administered 2017-06-05: 60 mg via INTRAMUSCULAR
  Filled 2017-06-05: qty 2

## 2017-06-05 MED ORDER — METHYLPREDNISOLONE SODIUM SUCC 125 MG IJ SOLR
125.0000 mg | Freq: Once | INTRAMUSCULAR | Status: AC
  Administered 2017-06-05: 125 mg via INTRAMUSCULAR
  Filled 2017-06-05: qty 2

## 2017-06-05 NOTE — ED Triage Notes (Addendum)
Pt c/o left lower back that radiates down left leg x 1 year, worsening over the last 3 month. Denies injury. Pt ambulatory in triage. Pt also c/o increased bowel movements, but no stool incontinence or diarrhea. Denies urinary incontinence.

## 2017-06-05 NOTE — ED Provider Notes (Signed)
[facility] Provider Note   CSN: [id number] Arrival date & time: 06/05/17  1043     History   Chief Complaint Chief Complaint  Patient presents with  . Back Pain    HPI Kathryn Golden is a 41 y.o. female.  The history is provided by the patient. No language interpreter was used.  Back Pain   This is a new problem. The current episode started more than 1 week ago. The problem occurs constantly. The problem has been gradually worsening. The pain is associated with no known injury. The pain is present in the lumbar spine. The pain is severe. The pain is the same all the time. Pertinent negatives include no chest pain and no abdominal pain. She has tried nothing for the symptoms. The treatment provided no relief.  Pt complains of pain in her back.  Pt sees Dr. Gerilyn Pilgrim for her back.  Pt is on pain management.  Past Medical History:  Diagnosis Date  . Abnormal Pap smear   . ADHD (attention deficit hyperactivity disorder)   . Anemia   . Anxiety 03/27/2013  . Breast lump 03/27/2013  . Degenerative cervical disc   . Depression   . Endometriosis   . Fibromyalgia 03/27/2013  . HPV (human papilloma virus) infection   . Hx of degenerative disc disease   . Migraine   . PIH (pregnancy induced hypertension)    in pregnancy only    Patient Active Problem List   Diagnosis Date Noted  . S/P vaginal hysterectomy 09/02/2014  . Endometriosis 08/05/2014  . Pelvic pain in female 08/05/2014  . Menorrhagia with regular cycle 07/29/2014  . Fibromyalgia 03/27/2013  . Anxiety 03/27/2013  . Breast lump 03/27/2013    Past Surgical History:  Procedure Laterality Date  . ABDOMINAL HYSTERECTOMY     partial  . DILATION AND CURETTAGE OF UTERUS    . dilation and curretage    . HAND SURGERY Right    tendon repair  . TUBAL LIGATION  07/24/2011   Procedure: POST PARTUM TUBAL LIGATION;  Surgeon: Catalina Antigua, MD;  Location: [facility];  Service: Gynecology;  Laterality: Bilateral;  . VAGINAL  HYSTERECTOMY N/A 09/02/2014   Procedure: HYSTERECTOMY VAGINAL;  Surgeon: Lazaro Arms, MD;  Location: [facility];  Service: Gynecology;  Laterality: N/A;    OB History    Gravida Para Term Preterm AB Living   5 3 3  0 2 3   SAB TAB Ectopic Multiple Live Births   2 0 0 0 1       Home Medications    Prior to Admission medications   Medication Sig Start Date End Date Taking? Authorizing Provider  aspirin-acetaminophen-caffeine (EXCEDRIN MIGRAINE) [phone] MG per tablet Take by mouth every 6 (six) hours as needed for headache.    [provider]  clonazePAM (KLONOPIN) 1 MG tablet Take 1 mg by mouth daily as needed for anxiety.     [provider]  DULoxetine (CYMBALTA) 20 MG capsule Take 20 mg by mouth daily.    [provider]  HYDROcodone-acetaminophen (NORCO) 10-325 MG per tablet Take 1 tablet by mouth 3 (three) times daily as needed for moderate pain.    [provider]  methocarbamol (ROBAXIN) 500 MG tablet Take 500 mg by mouth daily as needed for muscle spasms.     [provider]  metroNIDAZOLE (FLAGYL) 500 MG tablet Take 1 tablet (500 mg total) by mouth 2 (two) times daily. 04/20/17   Lazaro Arms, MD  pregabalin (LYRICA) 150 MG capsule Take 150 mg by mouth 2 (two) times daily.    [provider]  rOPINIRole (REQUIP) 0.5 MG tablet Take 0.5 mg by mouth at bedtime.    [provider]  topiramate (TOPAMAX) 50 MG tablet Take 50 mg by mouth daily as needed.     [provider]    Family History Family History  Problem Relation Age of Onset  . Cancer Father        kidney   . Hypertension Father   . Heart disease Father   . Hyperlipidemia Father   . Other Brother        Factor 13 def.  . Clotting disorder Brother   . Hyperlipidemia Paternal Grandfather   . Heart disease Paternal Grandfather   . Hypertension Other     Social History Social History  Substance Use Topics  . Smoking status: Current Every  Day Smoker    Packs/day: 1.00    Years: 20.00    Types: Cigarettes  . Smokeless tobacco: Never Used  . Alcohol use Yes     Comment: occassional     Allergies   Patient has no known allergies.   Review of Systems Review of Systems  Cardiovascular: Negative for chest pain.  Gastrointestinal: Negative for abdominal pain.  Musculoskeletal: Positive for back pain.  All other systems reviewed and are negative.    Physical Exam Updated Vital Signs BP 131/86   Pulse 99   Temp 98.5 F (36.9 C) (Oral)   Resp 16   Ht 5\' 7"  (1.702 m)   Wt 62.1 kg (137 lb)   LMP 07/23/2014   SpO2 99%   BMI 21.46 kg/m   Physical Exam  Constitutional: She appears well-developed and well-nourished. No distress.  HENT:  Head: Normocephalic and atraumatic.  Eyes: Conjunctivae are normal.  Neck: Neck supple.  Cardiovascular: Normal rate and regular rhythm.   No murmur heard. Pulmonary/Chest: Effort normal and breath sounds normal. No respiratory distress.  Abdominal: Soft. There is no tenderness.  Musculoskeletal: Normal range of motion. She exhibits no edema.  Tender ls spine,  Ns and nv intact   Neurological: She is alert.  Skin: Skin is warm and dry.  Psychiatric: She has a normal mood and affect.  Nursing note and vitals reviewed.    ED Treatments / Results  Labs (all labs ordered are listed, but only abnormal results are displayed) Labs Reviewed - No data to display  EKG  EKG Interpretation None       Radiology No results found.  Procedures Procedures (including critical care time)  Medications Ordered in ED Medications  methylPREDNISolone sodium succinate (SOLU-MEDROL) 125 mg/2 mL injection 125 mg (125 mg Intramuscular Given 06/05/17 1203)  ketorolac (TORADOL) injection 60 mg (60 mg Intramuscular Given 06/05/17 1205)     Initial Impression / Assessment and Plan / ED Course  I have reviewed the triage vital signs and the nursing notes.  Pertinent labs & imaging  results that were available during my care of the patient were reviewed by me and considered in my medical decision making (see chart for details).       Final Clinical Impressions(s) / ED Diagnoses   Final diagnoses:  Acute low back pain without sciatica, unspecified back pain laterality    New Prescriptions Discharge Medication List as of 06/05/2017 12:11 PM    An After Visit Summary was printed and given to the patient.    Elson Areas, [place] 06/05/17 [phone]  Blane OharaZavitz, [place], MD 06/09/17 [phone]

## 2017-06-05 NOTE — Discharge Instructions (Signed)
Call your Physician to schedule pain management

## 2017-06-14 ENCOUNTER — Other Ambulatory Visit (HOSPITAL_COMMUNITY): Payer: Self-pay | Admitting: Family Medicine

## 2017-06-14 DIAGNOSIS — K5792 Diverticulitis of intestine, part unspecified, without perforation or abscess without bleeding: Secondary | ICD-10-CM

## 2017-06-15 ENCOUNTER — Ambulatory Visit (HOSPITAL_COMMUNITY)
Admission: RE | Admit: 2017-06-15 | Discharge: 2017-06-15 | Disposition: A | Discharge status: Home / Self Care | Payer: Medicaid Other | Source: Ambulatory Visit | Attending: Family Medicine | Admitting: Family Medicine

## 2017-06-15 ENCOUNTER — Encounter (HOSPITAL_COMMUNITY): Payer: Self-pay

## 2017-06-15 DIAGNOSIS — R10824 Left lower quadrant rebound abdominal tenderness: Secondary | ICD-10-CM | POA: Diagnosis present

## 2017-06-15 DIAGNOSIS — R112 Nausea with vomiting, unspecified: Secondary | ICD-10-CM | POA: Diagnosis not present

## 2017-06-15 DIAGNOSIS — R197 Diarrhea, unspecified: Secondary | ICD-10-CM | POA: Diagnosis not present

## 2017-06-15 DIAGNOSIS — R1032 Left lower quadrant pain: Secondary | ICD-10-CM | POA: Diagnosis present

## 2017-06-15 DIAGNOSIS — K56699 Other intestinal obstruction unspecified as to partial versus complete obstruction: Secondary | ICD-10-CM | POA: Insufficient documentation

## 2017-06-15 DIAGNOSIS — K5792 Diverticulitis of intestine, part unspecified, without perforation or abscess without bleeding: Secondary | ICD-10-CM

## 2017-06-15 MED ORDER — IOPAMIDOL (ISOVUE-300) INJECTION 61%
INTRAVENOUS | Status: AC
  Administered 2017-06-15: 100 mL
  Filled 2017-06-15: qty 30

## 2017-06-18 ENCOUNTER — Other Ambulatory Visit: Payer: Self-pay | Admitting: Neurology

## 2017-06-18 DIAGNOSIS — M5416 Radiculopathy, lumbar region: Secondary | ICD-10-CM

## 2017-06-26 ENCOUNTER — Encounter: Payer: Self-pay | Admitting: Internal Medicine

## 2017-06-28 ENCOUNTER — Other Ambulatory Visit: Payer: Self-pay | Admitting: Obstetrics & Gynecology

## 2017-06-28 ENCOUNTER — Ambulatory Visit
Admission: RE | Admit: 2017-06-28 | Discharge: 2017-06-28 | Disposition: A | Discharge status: Home / Self Care | Payer: Medicaid Other | Source: Ambulatory Visit | Attending: Neurology | Admitting: Neurology

## 2017-06-28 DIAGNOSIS — M5416 Radiculopathy, lumbar region: Secondary | ICD-10-CM

## 2017-06-28 MED ORDER — DIAZEPAM 5 MG PO TABS
10.0000 mg | ORAL_TABLET | Freq: Once | ORAL | Status: AC
  Administered 2017-06-28: 10 mg via ORAL

## 2017-06-28 MED ORDER — METHYLPREDNISOLONE ACETATE 40 MG/ML INJ SUSP (RADIOLOG
120.0000 mg | Freq: Once | INTRAMUSCULAR | Status: AC
  Administered 2017-06-28: 120 mg via EPIDURAL

## 2017-06-28 MED ORDER — IOPAMIDOL (ISOVUE-M 200) INJECTION 41%
1.0000 mL | Freq: Once | INTRAMUSCULAR | Status: AC
  Administered 2017-06-28: 1 mL via EPIDURAL

## 2017-06-28 NOTE — Discharge Instructions (Signed)

## 2017-06-29 ENCOUNTER — Encounter: Payer: Self-pay | Admitting: Gastroenterology

## 2017-07-06 ENCOUNTER — Ambulatory Visit (INDEPENDENT_AMBULATORY_CARE_PROVIDER_SITE_OTHER): Payer: Medicaid Other | Admitting: Obstetrics & Gynecology

## 2017-07-06 ENCOUNTER — Encounter: Payer: Self-pay | Admitting: Obstetrics & Gynecology

## 2017-07-06 VITALS — BP 102/68 | HR 95 | Ht 68.0 in | Wt 142.5 lb

## 2017-07-06 DIAGNOSIS — B9689 Other specified bacterial agents as the cause of diseases classified elsewhere: Secondary | ICD-10-CM

## 2017-07-06 DIAGNOSIS — Z01411 Encounter for gynecological examination (general) (routine) with abnormal findings: Secondary | ICD-10-CM | POA: Diagnosis not present

## 2017-07-06 DIAGNOSIS — N760 Acute vaginitis: Secondary | ICD-10-CM | POA: Diagnosis not present

## 2017-07-06 DIAGNOSIS — Z0001 Encounter for general adult medical examination with abnormal findings: Secondary | ICD-10-CM | POA: Diagnosis not present

## 2017-07-06 DIAGNOSIS — Z01419 Encounter for gynecological examination (general) (routine) without abnormal findings: Secondary | ICD-10-CM

## 2017-07-06 MED ORDER — METRONIDAZOLE 0.75 % VA GEL: VAGINAL | 2 refills | Status: DC

## 2017-07-06 NOTE — Progress Notes (Signed)
Subjective:     Kathryn Golden is a 41 y.o. female here for a routine exam.  Patient's last menstrual period was 07/23/2014. [id number] Birth Control Method:  Patient is status post vaginal hysterectomy Menstrual Calendar(currently): Amenorrheic  Current complaints: Postcoital vaginal odor.   Current acute medical issues:  Sciatica and GI issues   Recent Gynecologic History Patient's last menstrual period was 07/23/2014. Last Pap: Prior to her vaginal hysterectomy,   Last mammogram: n/a,    Past Medical History:  Diagnosis Date  . Abnormal Pap smear   . ADHD (attention deficit hyperactivity disorder)   . Anemia   . Anxiety 03/27/2013  . Breast lump 03/27/2013  . Degenerative cervical disc   . Depression   . Diverticulosis   . Endometriosis   . Fibromyalgia 03/27/2013  . HPV (human papilloma virus) infection   . Hx of degenerative disc disease   . Migraine   . PIH (pregnancy induced hypertension)    in pregnancy only    Past Surgical History:  Procedure Laterality Date  . ABDOMINAL HYSTERECTOMY     partial  . DILATION AND CURETTAGE OF UTERUS    . dilation and curretage    . HAND SURGERY Right    tendon repair  . TUBAL LIGATION  07/24/2011   Procedure: POST PARTUM TUBAL LIGATION;  Surgeon: Catalina Antigua, MD;  Location: [facility];  Service: Gynecology;  Laterality: Bilateral;  . VAGINAL HYSTERECTOMY N/A 09/02/2014   Procedure: HYSTERECTOMY VAGINAL;  Surgeon: Lazaro Arms, MD;  Location: [facility];  Service: Gynecology;  Laterality: N/A;    OB History    Gravida Para Term Preterm AB Living   0 2 3   SAB TAB Ectopic Multiple Live Births   2 0 0 0 1      Social History   Social History  . Marital status: Divorced    Spouse name: N/A  . Number of children: N/A  . Years of education: N/A   Social History Main Topics  . Smoking status: Current Every Day Smoker    Packs/day: 1.00    Years: 20.00    Types: Cigarettes  . Smokeless tobacco: Never Used  . Alcohol use Yes      Comment: occassional  . Drug use: No  . Sexual activity: Yes    Birth control/ protection: Surgical   Other Topics Concern  . None   Social History Narrative  . None    Family History  Problem Relation Age of Onset  . Cancer Father        kidney   . Hypertension Father   . Heart disease Father   . Hyperlipidemia Father   . Other Brother        Factor 13 def.  . Clotting disorder Brother   . Hyperlipidemia Paternal Grandfather   . Heart disease Paternal Grandfather   . Hypertension Other      Current Outpatient Prescriptions:  .  aspirin-acetaminophen-caffeine (EXCEDRIN MIGRAINE) 250-250-65 MG per tablet, Take by mouth every 6 (six) hours as needed for headache., Disp: , Rfl:  .  baclofen (LIORESAL) 10 MG tablet, Take 10 mg by mouth 3 (three) times daily., Disp: , Rfl:  .  clonazePAM (KLONOPIN) 1 MG tablet, Take 1 mg by mouth daily as needed for anxiety. , Disp: , Rfl:  .  DULoxetine (CYMBALTA) 20 MG capsule, Take 20 mg by mouth daily., Disp: , Rfl:  .  HYDROcodone-acetaminophen (NORCO) 10-325 MG per tablet, Take 1 tablet  by mouth 3 (three) times daily as needed for moderate pain., Disp: , Rfl:  .  pantoprazole (PROTONIX) 20 MG tablet, Take 20 mg by mouth daily., Disp: , Rfl:  .  pregabalin (LYRICA) 150 MG capsule, Take 200 mg by mouth at bedtime. , Disp: , Rfl:  .  rOPINIRole (REQUIP) 0.5 MG tablet, Take 0.5 mg by mouth at bedtime., Disp: , Rfl:  .  metroNIDAZOLE (METROGEL VAGINAL) 0.75 % vaginal gel, Nightly x 5 nights, Disp: 70 g, Rfl: 2 .  topiramate (TOPAMAX) 50 MG tablet, Take 50 mg by mouth daily as needed. , Disp: , Rfl:   Review of Systems  Review of Systems  Constitutional: Negative for fever, chills, weight loss, malaise/fatigue and diaphoresis.  HENT: Negative for hearing loss, ear pain, nosebleeds, congestion, sore throat, neck pain, tinnitus and ear discharge.   Eyes: Negative for blurred vision, double vision, photophobia, pain, discharge and redness.   Respiratory: Negative for cough, hemoptysis, sputum production, shortness of breath, wheezing and stridor.   Cardiovascular: Negative for chest pain, palpitations, orthopnea, claudication, leg swelling and PND.  Gastrointestinal: negative for abdominal pain. Negative for heartburn, nausea, vomiting, diarrhea, constipation, blood in stool and melena.  Genitourinary: Negative for dysuria, urgency, frequency, hematuria and flank pain.  Musculoskeletal: Negative for myalgias, back pain, joint pain and falls.  Skin: Negative for itching and rash.  Neurological: Negative for dizziness, tingling, tremors, sensory change, speech change, focal weakness, seizures, loss of consciousness, weakness and headaches.  Endo/Heme/Allergies: Negative for environmental allergies and polydipsia. Does not bruise/bleed easily.  Psychiatric/Behavioral: Negative for depression, suicidal ideas, hallucinations, memory loss and substance abuse. The patient is not nervous/anxious and does not have insomnia.        Objective:  Blood pressure 102/68, pulse 95, height  (1.727 m), weight 142 lb 8 oz (64.6 kg), last menstrual period 07/23/2014.   Physical Exam  Vitals reviewed. Constitutional: She is oriented to person, place, and time. She appears well-developed and well-nourished.  HENT:  Head: Normocephalic and atraumatic.        Right Ear: External ear normal.  Left Ear: External ear normal.  Nose: Nose normal.  Mouth/Throat: Oropharynx is clear and moist.  Eyes: Conjunctivae and EOM are normal. Pupils are equal, round, and reactive to light. Right eye exhibits no discharge. Left eye exhibits no discharge. No scleral icterus.  Neck: Normal range of motion. Neck supple. No tracheal deviation present. No thyromegaly present.  Cardiovascular: Normal rate, regular rhythm, normal heart sounds and intact distal pulses.  Exam reveals no gallop and no friction rub.   No murmur heard. Respiratory: Effort normal and breath  sounds normal. No respiratory distress. She has no wheezes. She has no rales. She exhibits no tenderness.  GI: Soft. Bowel sounds are normal. She exhibits no distension and no mass. There is no tenderness. There is no rebound and no guarding.  Genitourinary:  Breasts no masses skin changes or nipple changes bilaterally      Vulva is normal without lesions Vagina is pink moist without discharge Cervix absent Uterus is absent Adnexa is negative with normal sized ovaries   Musculoskeletal: Normal range of motion. She exhibits no edema and no tenderness.  Neurological: She is alert and oriented to person, place, and time. She has normal reflexes. She displays normal reflexes. No cranial nerve deficit. She exhibits normal muscle tone. Coordination normal.  Skin: Skin is warm and dry. No rash noted. No erythema. No pallor.  Psychiatric: She has a normal mood  and affect. Her behavior is normal. Judgment and thought content normal.       Medications Ordered at today's visit: Meds ordered this encounter  Medications  . pantoprazole (PROTONIX) 20 MG tablet    Sig: Take 20 mg by mouth daily.  . metroNIDAZOLE (METROGEL VAGINAL) 0.75 % vaginal gel    Sig: Nightly x 5 nights    Dispense:  70 g    Refill:  2    Other orders placed at today's visit: No orders of the defined types were placed in this encounter.     Assessment:    Healthy female exam.   Recurrent postcoital bacterial vaginosis   Plan:    Contraception: status post hysterectomy. Follow up in: 2 years. use metro gel 1/2 applicator post coital     Return in about 2 years (around 07/07/2019) for yearly, with Dr Despina Hidden.

## 2017-07-24 DIAGNOSIS — M5416 Radiculopathy, lumbar region: Secondary | ICD-10-CM | POA: Diagnosis not present

## 2017-07-25 ENCOUNTER — Other Ambulatory Visit (HOSPITAL_COMMUNITY): Payer: Self-pay | Admitting: Neurology

## 2017-07-25 DIAGNOSIS — M5416 Radiculopathy, lumbar region: Secondary | ICD-10-CM

## 2017-08-02 ENCOUNTER — Ambulatory Visit (HOSPITAL_COMMUNITY)
Admission: RE | Admit: 2017-08-02 | Discharge: 2017-08-02 | Disposition: A | Discharge status: Home / Self Care | Payer: Medicaid Other | Source: Ambulatory Visit | Attending: Neurology | Admitting: Neurology

## 2017-08-02 DIAGNOSIS — M5136 Other intervertebral disc degeneration, lumbar region: Secondary | ICD-10-CM | POA: Insufficient documentation

## 2017-08-02 DIAGNOSIS — M5127 Other intervertebral disc displacement, lumbosacral region: Secondary | ICD-10-CM | POA: Insufficient documentation

## 2017-08-02 DIAGNOSIS — M5416 Radiculopathy, lumbar region: Secondary | ICD-10-CM | POA: Diagnosis present

## 2017-08-10 ENCOUNTER — Ambulatory Visit: Payer: Self-pay | Admitting: Gastroenterology

## 2017-08-17 ENCOUNTER — Ambulatory Visit (INDEPENDENT_AMBULATORY_CARE_PROVIDER_SITE_OTHER): Payer: Medicaid Other | Admitting: Gastroenterology

## 2017-08-17 ENCOUNTER — Encounter: Payer: Self-pay | Admitting: Gastroenterology

## 2017-08-17 ENCOUNTER — Other Ambulatory Visit: Payer: Self-pay

## 2017-08-17 ENCOUNTER — Telehealth: Payer: Self-pay

## 2017-08-17 VITALS — BP 125/77 | HR 95 | Temp 97.8°F | Ht 68.0 in | Wt 145.0 lb

## 2017-08-17 DIAGNOSIS — K529 Noninfective gastroenteritis and colitis, unspecified: Secondary | ICD-10-CM | POA: Insufficient documentation

## 2017-08-17 DIAGNOSIS — R109 Unspecified abdominal pain: Secondary | ICD-10-CM | POA: Insufficient documentation

## 2017-08-17 DIAGNOSIS — R112 Nausea with vomiting, unspecified: Secondary | ICD-10-CM

## 2017-08-17 DIAGNOSIS — R1084 Generalized abdominal pain: Secondary | ICD-10-CM

## 2017-08-17 MED ORDER — NA SULFATE-K SULFATE-MG SULF 17.5-3.13-1.6 GM/177ML PO SOLN: 1.0000 | ORAL | 0 refills | Status: DC

## 2017-08-17 NOTE — Assessment & Plan Note (Signed)
41 year old female with chronic diarrhea for as long as she can remember.  No nocturnal symptoms.  No blood in the stool.  Cannot recall last formed stool she had.  Really no significant abdominal pain.  Recent CT imaging unremarkable.  Suspect we are dealing with IBS-D.  Need to exclude celiac disease, IBD.  Check thyroid status.  We will plan on colonoscopy with deep sedation in the OR in the upcoming future.  I have discussed the risks, alternatives, benefits with regards to but not limited to the risk of reaction to medication, bleeding, infection, perforation and the patient is agreeable to proceed. Written consent to be obtained.

## 2017-08-17 NOTE — Patient Instructions (Signed)
1. Please have your blood work done at least a few days before your procedures. 2. Take pantoprazole every day. 3. Limit Excedrin, aspirin products, ibuprofen for now. 4. Upper endoscopy and colonoscopy as scheduled.  Please see separate instructions.

## 2017-08-17 NOTE — Assessment & Plan Note (Signed)
Recurrent nausea vomiting postprandially, globus, GERD-like symptoms.  In the setting of Excedrin use, cannot exclude gastritis, peptic ulcer disease.  Inadequate response to Protonix.  Plan for EGD in the near future with Dr. Benard Rinkoark, deep sedation in the OR.  I have discussed the risks, alternatives, benefits with regards to but not limited to the risk of reaction to medication, bleeding, infection, perforation and the patient is agreeable to proceed. Written consent to be obtained.

## 2017-08-17 NOTE — Progress Notes (Signed)
Primary Care Physician:  Pearson Grippe, MD  Primary Gastroenterologist:  Roetta Sessions, MD   Chief Complaint  Patient presents with  . Diarrhea    x years  . Gastroesophageal Reflux    x 6 months    HPI:  Kathryn Golden is a 41 y.o. female here at the request of Pearson Grippe for further evaluation of GERD and diarrhea.  Patient was apparently seen remotely around 2006.  She tells me she never followed through with colonoscopy as recommended.  She had fear of the procedure.  She has a lifelong history of diarrhea.  Typically diarrhea occurs within 30 minutes of meals.  Does not matter what she eats.  She has had diarrhea for as long as she can remember.  She rarely has a solid stool.  Denies nocturnal symptoms.  She notes that she has 5-6 stools daily even though she takes hydrocodone regularly.  Never had a workup.  For the past 6 months she has had intermittent nausea with vomiting.  Hurts in the chest when she vomits.  Occurs no matter what she eats.  Constantly feels like she has a lump in the throat.  Before the vomiting started she was having burning in the stomach Zantac did not help.  Protonix has helped modestly. She notes that she is not always taking daily at this time.   One year ago she was taking 6-8 Excedrin migraines daily.  She was put on Topamax and was receiving Botox for migraines which seem to help.  She continues to take Excedrin at least 3-4 times per week.  CT abdomen pelvis with contrast dated 06/15/2017 unremarkable.  Patient does not know her mother's family history.    Current Outpatient Prescriptions  Medication Sig Dispense Refill  . aspirin-acetaminophen-caffeine (EXCEDRIN MIGRAINE) 250-250-65 MG per tablet Take by mouth every 6 (six) hours as needed for headache.    . baclofen (LIORESAL) 10 MG tablet Take 10 mg by mouth 3 (three) times daily.    . clonazePAM (KLONOPIN) 1 MG tablet Take 1 mg by mouth daily as needed for anxiety.     . gabapentin (NEURONTIN) 300 MG  capsule Take 300 mg by mouth as needed.    Marland Kitchen HYDROcodone-acetaminophen (NORCO) 10-325 MG per tablet Take 1 tablet by mouth 3 (three) times daily as needed for moderate pain.    . metroNIDAZOLE (METROGEL VAGINAL) 0.75 % vaginal gel Nightly x 5 nights (Patient taking differently: as needed. Nightly x 5 nights) 70 g 2  . pantoprazole (PROTONIX) 20 MG tablet Take 40 mg by mouth daily.     Marland Kitchen rOPINIRole (REQUIP) 0.5 MG tablet Take 0.5 mg by mouth at bedtime.    . topiramate (TOPAMAX) 50 MG tablet Take 50 mg by mouth daily as needed.      No current facility-administered medications for this visit.     Allergies as of 08/17/2017  . (No Known Allergies)    Past Medical History:  Diagnosis Date  . Abnormal Pap smear   . ADHD (attention deficit hyperactivity disorder)   . Anemia   . Anxiety 03/27/2013  . Breast lump 03/27/2013  . Degenerative cervical disc   . Depression   . Diverticulosis   . Endometriosis   . Fibromyalgia 03/27/2013  . HPV (human papilloma virus) infection   . Hx of degenerative disc disease   . Migraine   . PIH (pregnancy induced hypertension)    in pregnancy only  . Sciatic nerve pain, right  Past Surgical History:  Procedure Laterality Date  . ABDOMINAL HYSTERECTOMY     partial  . DILATION AND CURETTAGE OF UTERUS    . dilation and curretage    . HAND SURGERY Right    tendon repair  . TUBAL LIGATION  07/24/2011   Procedure: POST PARTUM TUBAL LIGATION;  Surgeon: Catalina AntiguaPeggy Constant, MD;  Location: [facility];  Service: Gynecology;  Laterality: Bilateral;  . VAGINAL HYSTERECTOMY N/A 09/02/2014   Procedure: HYSTERECTOMY VAGINAL;  Surgeon: Lazaro ArmsLuther H Eure, MD;  Location: [facility];  Service: Gynecology;  Laterality: N/A;    Family History  Problem Relation Age of Onset  . Cancer Father        kidney   . Hypertension Father   . Heart disease Father   . Hyperlipidemia Father   . Other Brother        Factor 13 def.  . Clotting disorder Brother   . Hyperlipidemia Paternal  Grandfather   . Heart disease Paternal Grandfather   . Ulcers Brother   . Hypertension Other   . Inflammatory bowel disease Neg Hx   . Celiac disease Neg Hx   . Colon cancer Neg Hx     Social History   Social History  . Marital status: Divorced    Spouse name: N/A  . Number of children: N/A  . Years of education: N/A   Occupational History  . Not on file.   Social History Main Topics  . Smoking status: Current Every Day Smoker    Packs/day: 1.00    Years: 20.00    Types: Cigarettes  . Smokeless tobacco: Never Used  . Alcohol use Yes     Comment: occassional  . Drug use: No  . Sexual activity: Yes    Birth control/ protection: Surgical   Other Topics Concern  . Not on file   Social History Narrative  . No narrative on file      ROS:  General: Negative for anorexia,fever, chills, fatigue, weakness. Patient lost some weight couple of months ago but now back at baseline. Eyes: Negative for vision changes.  ENT: Negative for hoarseness, difficulty swallowing , nasal congestion. CV: Negative for chest pain, angina, palpitations, dyspnea on exertion, peripheral edema.  Respiratory: Negative for dyspnea at rest, dyspnea on exertion, cough, sputum, wheezing.  GI: See history of present illness. GU:  Negative for dysuria, hematuria, urinary incontinence, urinary frequency, nocturnal urination.  MS: Negative for joint pain, +++low back pain.  Derm: Negative for rash or itching.  Neuro: Negative for weakness, abnormal sensation, seizure, frequent headaches, memory loss, confusion.  Psych: Negative for anxiety, depression, suicidal ideation, hallucinations.  Endo: Negative for unusual weight change.  Heme: Negative for bruising or bleeding. Allergy: Negative for rash or hives.    Physical Examination:  BP 125/77   Pulse 95   Temp 97.8 F (36.6 C) (Oral)   Ht 5\' 8"  (1.727 m)   Wt 145 lb (65.8 kg)   LMP 07/23/2014   BMI 22.05 kg/m    General: Well-nourished,  well-developed in no acute distress.  Head: Normocephalic, atraumatic.   Eyes: Conjunctiva pink, no icterus. Mouth: Oropharyngeal mucosa moist and pink , no lesions erythema or exudate. Neck: Supple without thyromegaly, masses, or lymphadenopathy.  Lungs: Clear to auscultation bilaterally.  Heart: Regular rate and rhythm, no murmurs rubs or gallops.  Abdomen: Bowel sounds are normal, nontender, nondistended, no hepatosplenomegaly or masses, no abdominal bruits or    hernia , no rebound or guarding.  Rectal: deferred Extremities: No lower extremity edema. No clubbing or deformities.  Neuro: Alert and oriented x 4 , grossly normal neurologically.  Skin: Warm and dry, no rash or jaundice.   Psych: Alert and cooperative, normal mood and affect.  Labs: Labs from June 14, 2018 White blood cell count 7800, hemoglobin 14.8, hematocrit 45, platelets 243,000, total bilirubin 0.2, alkaline phosphatase 60, AST 15, ALT 10, albumin 4.7  Imaging Studies: Mr Lumbar Spine Wo Contrast  Result Date: 08/02/2017 CLINICAL DATA:  Low back pain radiating into the left leg for 8 months. No known injury. EXAM: MRI LUMBAR SPINE WITHOUT CONTRAST TECHNIQUE: Multiplanar, multisequence MR imaging of the lumbar spine was performed. No intravenous contrast was administered. COMPARISON:  CT abdomen and pelvis 06/15/2017. FINDINGS: Segmentation:  Standard. Alignment:  Normal. Vertebrae: No fracture or worrisome lesion. Hemangioma in S1 is noted. Conus medullaris: Extends to the T12-L1 level and appears normal. Paraspinal and other soft tissues: Negative. Disc levels: T11-12 is imaged in the sagittal plane only and negative. T12-L1:  Negative. L1-2:  Negative. L2-3:  Negative. L3-4:  Negative. L4-5: Shallow disc bulge with more focally protruding disc in the left subarticular recess results in encroachment on the L5 roots, worse on the left. The foramina are open. L5-S1: There is an annular fissure and small down turning left  subarticular recess protrusion. The disc protrusion contacts the descending left S1 root. The foramina are open. IMPRESSION: Degenerative disc disease at L4-5 results in encroachment on the descending L5 roots, worse on the left where there is more focally protruding disc. Down turning left subarticular recess protrusion at L5-S1 contacts the descending left S1 root. Electronically Signed   By: Drusilla Kanner M.D.   On: 08/02/2017 10:18

## 2017-08-17 NOTE — Telephone Encounter (Signed)
Tried to call pt to inform of pre-op appt 09/20/17 at 11:00am, LMOVM. Letter mailed.

## 2017-08-20 NOTE — Progress Notes (Signed)
CC'ED TO PCP 

## 2017-09-11 ENCOUNTER — Other Ambulatory Visit: Payer: Self-pay | Admitting: Gastroenterology

## 2017-09-12 ENCOUNTER — Telehealth: Payer: Self-pay

## 2017-09-12 LAB — TISSUE TRANSGLUTAMINASE, IGA: Transglutaminase IgA: <2 U/mL (ref 0–3)

## 2017-09-12 LAB — IGA: IgA/Immunoglobulin A, Serum: 303 mg/dL (ref 87–352)

## 2017-09-12 NOTE — Telephone Encounter (Signed)
[facility] results placed in box for Tana CoastLeslie Lewis, PA.

## 2017-09-16 ENCOUNTER — Encounter: Payer: Self-pay | Admitting: Gastroenterology

## 2017-09-16 NOTE — Telephone Encounter (Signed)
See result note.  

## 2017-09-16 NOTE — Progress Notes (Signed)
Please let patient know her celiac screen was negative. Procedures as planned.

## 2017-09-17 NOTE — Progress Notes (Signed)
PT is aware.

## 2017-09-18 NOTE — Patient Instructions (Signed)
Kathryn Golden  09/18/2017     @PREFPERIOPPHARMACY @   Your procedure is scheduled on  09/26/2017   [facility] at  730  A.M.  Call this number if you have problems the morning of surgery:  [phone]   Remember:  Do not eat food or drink liquids after midnight.  Take these medicines the morning of surgery with A SIP OF WATER  Baclofen, celebrex, klonopin, cymbalta, hydrocodone, claritin, protonix.   Do not wear jewelry, make-up or nail polish.  Do not wear lotions, powders, or perfumes, or deoderant.  Do not shave 48 hours prior to surgery.  Men may shave face and neck.  Do not bring valuables to the hospital.  [facility] is not responsible for any belongings or valuables.  Contacts, dentures or bridgework may not be worn into surgery.  Leave your suitcase in the car.  After surgery it may be brought to your room.  For patients admitted to the hospital, discharge time will be determined by your treatment team.  Patients discharged the day of surgery will not be allowed to drive home.   Name and phone number of your driver:   family Special instructions:  Follow the diet and prep instructions given to you by Dr Luvenia Starchourk's office.  Please read over the following fact sheets that you were given. Anesthesia Post-op Instructions and Care and Recovery After Surgery       Colonoscopy, Adult A colonoscopy is an exam to look at the entire large intestine. During the exam, a lubricated, bendable tube is inserted into the anus and then passed into the rectum, colon, and other parts of the large intestine. A colonoscopy is often done as a part of normal colorectal screening or in response to certain symptoms, such as anemia, persistent diarrhea, abdominal pain, and blood in the stool. The exam can help screen for and diagnose medical problems, including:  Tumors.  Polyps.  Inflammation.  Areas of bleeding.  Tell a health care provider about:  Any  allergies you have.  All medicines you are taking, including vitamins, herbs, eye drops, creams, and over-the-counter medicines.  Any problems you or family members have had with anesthetic medicines.  Any blood disorders you have.  Any surgeries you have had.  Any medical conditions you have.  Any problems you have had passing stool. What are the risks? Generally, this is a safe procedure. However, problems may occur, including:  Bleeding.  A tear in the intestine.  A reaction to medicines given during the exam.  Infection (rare).  What happens before the procedure? Eating and drinking restrictions Follow instructions from your health care provider about eating and drinking, which may include:  A few days before the procedure - follow a low-fiber diet. Avoid nuts, seeds, dried fruit, raw fruits, and vegetables.  1-3 days before the procedure - follow a clear liquid diet. Drink only clear liquids, such as clear broth or bouillon, black coffee or tea, clear juice, clear soft drinks or sports drinks, gelatin dessert, and popsicles. Avoid any liquids that contain red or purple dye.  On the day of the procedure - do not eat or drink anything during the 2 hours before the procedure, or within the time period that your health care provider recommends.  Bowel prep If you were prescribed an oral bowel prep to clean out your colon:  Take it as told by your health care provider. Starting  the day before your procedure, you will need to drink a large amount of medicated liquid. The liquid will cause you to have multiple loose stools until your stool is almost clear or light green.  If your skin or anus gets irritated from diarrhea, you may use these to relieve the irritation: ? Medicated wipes, such as adult wet wipes with aloe and vitamin E. ? A skin soothing-product like petroleum jelly.  If you vomit while drinking the bowel prep, take a break for up to 60 minutes and then begin the  bowel prep again. If vomiting continues and you cannot take the bowel prep without vomiting, call your health care provider.  General instructions  Ask your health care provider about changing or stopping your regular medicines. This is especially important if you are taking diabetes medicines or blood thinners.  Plan to have someone take you home from the hospital or clinic. What happens during the procedure?  An IV tube may be inserted into one of your veins.  You will be given medicine to help you relax (sedative).  To reduce your risk of infection: ? Your health care team will wash or sanitize their hands. ? Your anal area will be washed with soap.  You will be asked to lie on your side with your knees bent.  Your health care provider will lubricate a long, thin, flexible tube. The tube will have a camera and a light on the end.  The tube will be inserted into your anus.  The tube will be gently eased through your rectum and colon.  Air will be delivered into your colon to keep it open. You may feel some pressure or cramping.  The camera will be used to take images during the procedure.  A small tissue sample may be removed from your body to be examined under a microscope (biopsy). If any potential problems are found, the tissue will be sent to a lab for testing.  If small polyps are found, your health care provider may remove them and have them checked for cancer cells.  The tube that was inserted into your anus will be slowly removed. The procedure may vary among health care providers and hospitals. What happens after the procedure?  Your blood pressure, heart rate, breathing rate, and blood oxygen level will be monitored until the medicines you were given have worn off.  Do not drive for 24 hours after the exam.  You may have a small amount of blood in your stool.  You may pass gas and have mild abdominal cramping or bloating due to the air that was used to inflate  your colon during the exam.  It is up to you to get the results of your procedure. Ask your health care provider, or the department performing the procedure, when your results will be ready. This information is not intended to replace advice given to you by your health care provider. Make sure you discuss any questions you have with your health care provider. Document Released: 10/06/2000 Document Revised: 08/09/2016 Document Reviewed: 12/21/2015 Elsevier Interactive Patient Education  2018 [employer].  Colonoscopy, Adult, Care After This sheet gives you information about how to care for yourself after your procedure. Your health care provider may also give you more specific instructions. If you have problems or questions, contact your health care provider. What can I expect after the procedure? After the procedure, it is common to have:  A small amount of blood in your stool for 24  hours after the procedure.  Some gas.  Mild abdominal cramping or bloating.  Follow these instructions at home: General instructions   For the first 24 hours after the procedure: ? Do not drive or use machinery. ? Do not sign important documents. ? Do not drink alcohol. ? Do your regular daily activities at a slower pace than normal. ? Eat soft, easy-to-digest foods. ? Rest often.  Take over-the-counter or prescription medicines only as told by your health care provider.  It is up to you to get the results of your procedure. Ask your health care provider, or the department performing the procedure, when your results will be ready. Relieving cramping and bloating  Try walking around when you have cramps or feel bloated.  Apply heat to your abdomen as told by your health care provider. Use a heat source that your health care provider recommends, such as a moist heat pack or a heating pad. ? Place a towel between your skin and the heat source. ? Leave the heat on for 20-30 minutes. ? Remove the  heat if your skin turns bright red. This is especially important if you are unable to feel pain, heat, or cold. You may have a greater risk of getting burned. Eating and drinking  Drink enough fluid to keep your urine clear or pale yellow.  Resume your normal diet as instructed by your health care provider. Avoid heavy or fried foods that are hard to digest.  Avoid drinking alcohol for as long as instructed by your health care provider. Contact a health care provider if:  You have blood in your stool 2-3 days after the procedure. Get help right away if:  You have more than a small spotting of blood in your stool.  You pass large blood clots in your stool.  Your abdomen is swollen.  You have nausea or vomiting.  You have a fever.  You have increasing abdominal pain that is not relieved with medicine. This information is not intended to replace advice given to you by your health care provider. Make sure you discuss any questions you have with your health care provider. Document Released: 05/23/2004 Document Revised: 07/03/2016 Document Reviewed: 12/21/2015 Elsevier Interactive Patient Education  2018 [employer]  Monitored Anesthesia Care Anesthesia is a term that refers to techniques, procedures, and medicines that help a person stay safe and comfortable during a medical procedure. Monitored anesthesia care, or sedation, is one type of anesthesia. Your anesthesia specialist may recommend sedation if you will be having a procedure that does not require you to be unconscious, such as:  Cataract surgery.  A dental procedure.  A biopsy.  A colonoscopy.  During the procedure, you may receive a medicine to help you relax (sedative). There are three levels of sedation:  Mild sedation. At this level, you may feel awake and relaxed. You will be able to follow directions.  Moderate sedation. At this level, you will be sleepy. You may not remember the procedure.  Deep sedation. At  this level, you will be asleep. You will not remember the procedure.  The more medicine you are given, the deeper your level of sedation will be. Depending on how you respond to the procedure, the anesthesia specialist may change your level of sedation or the type of anesthesia to fit your needs. An anesthesia specialist will monitor you closely during the procedure. Let your health care provider know about:  Any allergies you have.  All medicines you are taking, including vitamins,  herbs, eye drops, creams, and over-the-counter medicines.  Any use of steroids (by mouth or as a cream).  Any problems you or family members have had with sedatives and anesthetic medicines.  Any blood disorders you have.  Any surgeries you have had.  Any medical conditions you have, such as sleep apnea.  Whether you are pregnant or may be pregnant.  Any use of cigarettes, alcohol, or street drugs. What are the risks? Generally, this is a safe procedure. However, problems may occur, including:  Getting too much medicine (oversedation).  Nausea.  Allergic reaction to medicines.  Trouble breathing. If this happens, a breathing tube may be used to help with breathing. It will be removed when you are awake and breathing on your own.  Heart trouble.  Lung trouble.  Before the procedure Staying hydrated Follow instructions from your health care provider about hydration, which may include:  Up to 2 hours before the procedure - you may continue to drink clear liquids, such as water, clear fruit juice, black coffee, and plain tea.  Eating and drinking restrictions Follow instructions from your health care provider about eating and drinking, which may include:  8 hours before the procedure - stop eating heavy meals or foods such as meat, fried foods, or fatty foods.  6 hours before the procedure - stop eating light meals or foods, such as toast or cereal.  6 hours before the procedure - stop  drinking milk or drinks that contain milk.  2 hours before the procedure - stop drinking clear liquids.  Medicines Ask your health care provider about:  Changing or stopping your regular medicines. This is especially important if you are taking diabetes medicines or blood thinners.  Taking medicines such as aspirin and ibuprofen. These medicines can thin your blood. Do not take these medicines before your procedure if your health care provider instructs you not to.  Tests and exams  You will have a physical exam.  You may have blood tests done to show: ? How well your kidneys and liver are working. ? How well your blood can clot.  General instructions  Plan to have someone take you home from the hospital or clinic.  If you will be going home right after the procedure, plan to have someone with you for 24 hours.  What happens during the procedure?  Your blood pressure, heart rate, breathing, level of pain and overall condition will be monitored.  An IV tube will be inserted into one of your veins.  Your anesthesia specialist will give you medicines as needed to keep you comfortable during the procedure. This may mean changing the level of sedation.  The procedure will be performed. After the procedure  Your blood pressure, heart rate, breathing rate, and blood oxygen level will be monitored until the medicines you were given have worn off.  Do not drive for 24 hours if you received a sedative.  You may: ? Feel sleepy, clumsy, or nauseous. ? Feel forgetful about what happened after the procedure. ? Have a sore throat if you had a breathing tube during the procedure. ? Vomit. This information is not intended to replace advice given to you by your health care provider. Make sure you discuss any questions you have with your health care provider. Document Released: 07/05/2005 Document Revised: 03/17/2016 Document Reviewed: 01/30/2016 Elsevier Interactive Patient Education   2018 [employer] Monitored Anesthesia Care, Care After These instructions provide you with information about caring for yourself after your procedure. Your  health care provider may also give you more specific instructions. Your treatment has been planned according to current medical practices, but problems sometimes occur. Call your health care provider if you have any problems or questions after your procedure. What can I expect after the procedure? After your procedure, it is common to:  Feel sleepy for several hours.  Feel clumsy and have poor balance for several hours.  Feel forgetful about what happened after the procedure.  Have poor judgment for several hours.  Feel nauseous or vomit.  Have a sore throat if you had a breathing tube during the procedure.  Follow these instructions at home: For at least 24 hours after the procedure:   Do not: ? Participate in activities in which you could fall or become injured. ? Drive. ? Use heavy machinery. ? Drink alcohol. ? Take sleeping pills or medicines that cause drowsiness. ? Make important decisions or sign legal documents. ? Take care of children on your own.  Rest. Eating and drinking  Follow the diet that is recommended by your health care provider.  If you vomit, drink water, juice, or soup when you can drink without vomiting.  Make sure you have little or no nausea before eating solid foods. General instructions  Have a responsible adult stay with you until you are awake and alert.  Take over-the-counter and prescription medicines only as told by your health care provider.  If you smoke, do not smoke without supervision.  Keep all follow-up visits as told by your health care provider. This is important. Contact a health care provider if:  You keep feeling nauseous or you keep vomiting.  You feel light-headed.  You develop a rash.  You have a fever. Get help right away if:  You have trouble  breathing. This information is not intended to replace advice given to you by your health care provider. Make sure you discuss any questions you have with your health care provider. Document Released: 01/30/2016 Document Revised: 05/31/2016 Document Reviewed: 01/30/2016 Elsevier Interactive Patient Education  [employer].

## 2017-09-20 ENCOUNTER — Encounter (HOSPITAL_COMMUNITY)
Admission: RE | Admit: 2017-09-20 | Discharge: 2017-09-20 | Disposition: A | Discharge status: Home / Self Care | Payer: Medicaid Other | Source: Ambulatory Visit | Attending: Internal Medicine | Admitting: Internal Medicine

## 2017-09-20 ENCOUNTER — Other Ambulatory Visit: Payer: Self-pay

## 2017-09-20 ENCOUNTER — Encounter (HOSPITAL_COMMUNITY): Payer: Self-pay

## 2017-09-20 DIAGNOSIS — Z01810 Encounter for preprocedural cardiovascular examination: Secondary | ICD-10-CM | POA: Insufficient documentation

## 2017-09-20 DIAGNOSIS — Z01812 Encounter for preprocedural laboratory examination: Secondary | ICD-10-CM | POA: Diagnosis present

## 2017-09-20 DIAGNOSIS — F17210 Nicotine dependence, cigarettes, uncomplicated: Secondary | ICD-10-CM | POA: Diagnosis not present

## 2017-09-20 DIAGNOSIS — I10 Essential (primary) hypertension: Secondary | ICD-10-CM | POA: Insufficient documentation

## 2017-09-20 DIAGNOSIS — F329 Major depressive disorder, single episode, unspecified: Secondary | ICD-10-CM | POA: Diagnosis not present

## 2017-09-20 DIAGNOSIS — M1990 Unspecified osteoarthritis, unspecified site: Secondary | ICD-10-CM | POA: Diagnosis not present

## 2017-09-20 DIAGNOSIS — G709 Myoneural disorder, unspecified: Secondary | ICD-10-CM | POA: Diagnosis not present

## 2017-09-20 DIAGNOSIS — M797 Fibromyalgia: Secondary | ICD-10-CM | POA: Diagnosis not present

## 2017-09-20 DIAGNOSIS — F419 Anxiety disorder, unspecified: Secondary | ICD-10-CM | POA: Insufficient documentation

## 2017-09-20 LAB — BASIC METABOLIC PANEL
ANION GAP: 8 (ref 5–15)
BUN: 9 mg/dL (ref 6–20)
CALCIUM: 9 mg/dL (ref 8.9–10.3)
CO2: 26 mmol/L (ref 22–32)
CREATININE: 0.65 mg/dL (ref 0.44–1.00)
Chloride: 103 mmol/L (ref 101–111)
Glucose, Bld: 92 mg/dL (ref 65–99)
Potassium: 3.5 mmol/L (ref 3.5–5.1)
SODIUM: 137 mmol/L (ref 135–145)

## 2017-09-20 LAB — CBC
HCT: 42.9 % (ref 36.0–46.0)
HEMOGLOBIN: 13.4 g/dL (ref 12.0–15.0)
MCH: 29.8 pg (ref 26.0–34.0)
MCHC: 31.2 g/dL (ref 30.0–36.0)
MCV: 95.3 fL (ref 78.0–100.0)
PLATELETS: 197 10*3/uL (ref 150–400)
RBC: 4.5 MIL/uL (ref 3.87–5.11)
RDW: 12.6 % (ref 11.5–15.5)
WBC: 6.4 10*3/uL (ref 4.0–10.5)

## 2017-09-26 ENCOUNTER — Ambulatory Visit (HOSPITAL_COMMUNITY)
Admission: RE | Admit: 2017-09-26 | Discharge: 2017-09-26 | Disposition: A | Discharge status: Home / Self Care | Payer: Medicaid Other | Source: Ambulatory Visit | Attending: Internal Medicine | Admitting: Internal Medicine

## 2017-09-26 ENCOUNTER — Encounter (HOSPITAL_COMMUNITY)
Admission: RE | Disposition: A | Discharge status: Home / Self Care | Payer: Self-pay | Source: Ambulatory Visit | Attending: Internal Medicine

## 2017-09-26 ENCOUNTER — Encounter (HOSPITAL_COMMUNITY): Payer: Self-pay | Admitting: *Deleted

## 2017-09-26 ENCOUNTER — Ambulatory Visit (HOSPITAL_COMMUNITY): Payer: Medicaid Other | Admitting: Anesthesiology

## 2017-09-26 DIAGNOSIS — K5730 Diverticulosis of large intestine without perforation or abscess without bleeding: Secondary | ICD-10-CM | POA: Diagnosis not present

## 2017-09-26 DIAGNOSIS — Z79899 Other long term (current) drug therapy: Secondary | ICD-10-CM | POA: Diagnosis not present

## 2017-09-26 DIAGNOSIS — Z6822 Body mass index (BMI) 22.0-22.9, adult: Secondary | ICD-10-CM | POA: Diagnosis not present

## 2017-09-26 DIAGNOSIS — I10 Essential (primary) hypertension: Secondary | ICD-10-CM | POA: Insufficient documentation

## 2017-09-26 DIAGNOSIS — K3189 Other diseases of stomach and duodenum: Secondary | ICD-10-CM | POA: Insufficient documentation

## 2017-09-26 DIAGNOSIS — R634 Abnormal weight loss: Secondary | ICD-10-CM | POA: Insufficient documentation

## 2017-09-26 DIAGNOSIS — R112 Nausea with vomiting, unspecified: Secondary | ICD-10-CM | POA: Diagnosis not present

## 2017-09-26 DIAGNOSIS — F419 Anxiety disorder, unspecified: Secondary | ICD-10-CM | POA: Insufficient documentation

## 2017-09-26 DIAGNOSIS — K209 Esophagitis, unspecified: Secondary | ICD-10-CM | POA: Diagnosis not present

## 2017-09-26 DIAGNOSIS — K210 Gastro-esophageal reflux disease with esophagitis: Secondary | ICD-10-CM | POA: Diagnosis not present

## 2017-09-26 DIAGNOSIS — K529 Noninfective gastroenteritis and colitis, unspecified: Secondary | ICD-10-CM | POA: Diagnosis not present

## 2017-09-26 DIAGNOSIS — F17210 Nicotine dependence, cigarettes, uncomplicated: Secondary | ICD-10-CM | POA: Diagnosis not present

## 2017-09-26 DIAGNOSIS — Z791 Long term (current) use of non-steroidal anti-inflammatories (NSAID): Secondary | ICD-10-CM | POA: Insufficient documentation

## 2017-09-26 DIAGNOSIS — Z7951 Long term (current) use of inhaled steroids: Secondary | ICD-10-CM | POA: Insufficient documentation

## 2017-09-26 DIAGNOSIS — R110 Nausea: Secondary | ICD-10-CM | POA: Diagnosis present

## 2017-09-26 HISTORY — PX: COLONOSCOPY WITH PROPOFOL: SHX5780

## 2017-09-26 HISTORY — PX: ESOPHAGOGASTRODUODENOSCOPY (EGD) WITH PROPOFOL: SHX5813

## 2017-09-26 HISTORY — PX: BIOPSY: SHX5522

## 2017-09-26 SURGERY — COLONOSCOPY WITH PROPOFOL
Anesthesia: Monitor Anesthesia Care

## 2017-09-26 MED ORDER — LIDOCAINE VISCOUS 2 % MT SOLN
5.0000 mL | Freq: Once | OROMUCOSAL | Status: AC
  Administered 2017-09-26: 5 mL via OROMUCOSAL

## 2017-09-26 MED ORDER — ONDANSETRON 4 MG PO TBDP
4.0000 mg | ORAL_TABLET | Freq: Once | ORAL | Status: AC
  Administered 2017-09-26: 4 mg via ORAL

## 2017-09-26 MED ORDER — LIDOCAINE HCL (PF) 1 % IJ SOLN
INTRAMUSCULAR | Status: AC
  Filled 2017-09-26: qty 5

## 2017-09-26 MED ORDER — SUCCINYLCHOLINE CHLORIDE 20 MG/ML IJ SOLN
INTRAMUSCULAR | Status: AC
  Filled 2017-09-26: qty 1

## 2017-09-26 MED ORDER — SODIUM CHLORIDE 0.9 % IJ SOLN
INTRAMUSCULAR | Status: AC
  Filled 2017-09-26: qty 10

## 2017-09-26 MED ORDER — LORAZEPAM 2 MG/ML IJ SOLN
0.5000 mg | Freq: Once | INTRAMUSCULAR | Status: AC
  Administered 2017-09-26: 0.5 mg via INTRAVENOUS
  Filled 2017-09-26: qty 1

## 2017-09-26 MED ORDER — MIDAZOLAM HCL 2 MG/2ML IJ SOLN
1.0000 mg | Freq: Once | INTRAMUSCULAR | Status: AC | PRN
  Administered 2017-09-26: 2 mg via INTRAVENOUS
  Filled 2017-09-26: qty 2

## 2017-09-26 MED ORDER — PROPOFOL 500 MG/50ML IV EMUL
INTRAVENOUS | Status: DC | PRN
  Administered 2017-09-26: 10:00:00 via INTRAVENOUS
  Administered 2017-09-26: 150 ug/kg/min via INTRAVENOUS

## 2017-09-26 MED ORDER — LIDOCAINE VISCOUS 2 % MT SOLN
OROMUCOSAL | Status: AC
  Filled 2017-09-26: qty 15

## 2017-09-26 MED ORDER — LACTATED RINGERS IV SOLN
INTRAVENOUS | Status: DC
  Administered 2017-09-26 (×2): via INTRAVENOUS

## 2017-09-26 MED ORDER — ONDANSETRON 4 MG PO TBDP
ORAL_TABLET | ORAL | Status: AC
  Filled 2017-09-26: qty 1

## 2017-09-26 MED ORDER — EPHEDRINE SULFATE 50 MG/ML IJ SOLN
INTRAMUSCULAR | Status: AC
  Filled 2017-09-26: qty 1

## 2017-09-26 MED ORDER — LACTATED RINGERS IV SOLN: INTRAVENOUS | Status: DC

## 2017-09-26 MED ORDER — PROPOFOL 10 MG/ML IV BOLUS
INTRAVENOUS | Status: AC
  Filled 2017-09-26: qty 60

## 2017-09-26 NOTE — H&P (Signed)
@LOGO @   Primary Care Physician:  Jani Gravel, MD Primary Gastroenterologist:  Dr. Gala Romney  Pre-Procedure History & Physical: HPI:  Kathryn Golden is a 41 y.o. female here for further evaluation chronic nausea vomiting and diarrhea via EGD and colonoscopy. Patient denies dysphagia. Celiac serological screen negative. Past Medical History:  Diagnosis Date  . Abnormal Pap smear   . ADHD (attention deficit hyperactivity disorder)   . Anemia   . Anxiety 03/27/2013  . Breast lump 03/27/2013  . Degenerative cervical disc   . Depression   . Diverticulosis   . Endometriosis   . Fibromyalgia 03/27/2013  . HPV (human papilloma virus) infection   . Hx of degenerative disc disease   . Migraine   . PIH (pregnancy induced hypertension)    in pregnancy only  . Sciatic nerve pain, right     Past Surgical History:  Procedure Laterality Date  . ABDOMINAL HYSTERECTOMY     partial  . DILATION AND CURETTAGE OF UTERUS    . dilation and curretage    . HAND SURGERY Right    tendon repair  . TUBAL LIGATION  07/24/2011   Procedure: POST PARTUM TUBAL LIGATION;  Surgeon: Mora Bellman, MD;  Location: [facility];  Service: Gynecology;  Laterality: Bilateral;  . VAGINAL HYSTERECTOMY N/A 09/02/2014   Procedure: HYSTERECTOMY VAGINAL;  Surgeon: Florian Buff, MD;  Location: [facility];  Service: Gynecology;  Laterality: N/A;    Prior to Admission medications   Medication Sig Start Date End Date Taking? Authorizing Provider  aspirin-acetaminophen-caffeine (EXCEDRIN MIGRAINE) [phone] MG per tablet Take 2 tablets by mouth daily as needed for headache.    Yes [provider]  baclofen (LIORESAL) 10 MG tablet Take 10 mg by mouth every 8 (eight) hours as needed for muscle spasms.    Yes [provider]  carisoprodol (SOMA) 350 MG tablet Take 350 mg by mouth daily as needed for muscle spasms.   Yes [provider]  celecoxib (CELEBREX) 100 MG capsule Take 100 mg by mouth daily.   Yes [provider]  clonazePAM (KLONOPIN) 1 MG tablet Take 1 mg by mouth 2 (two) times daily as needed for anxiety.    Yes [provider]  fluticasone (FLONASE) 50 MCG/ACT nasal spray Place 2 sprays into both nostrils daily.   Yes [provider]  HYDROcodone-acetaminophen (NORCO) 10-325 MG per tablet Take 1 tablet by mouth 3 (three) times daily as needed for moderate pain.   Yes [provider]  ibuprofen (ADVIL,MOTRIN) 600 MG tablet Take 600 mg by mouth every 6 (six) hours as needed for moderate pain.   Yes [provider]  loratadine (CLARITIN) 10 MG tablet Take 10 mg by mouth daily.   Yes [provider]  Na Sulfate-K Sulfate-Mg Sulf (SUPREP BOWEL PREP KIT) 17.5-3.13-1.6 GM/177ML SOLN Take 1 kit by mouth as directed. 08/17/17  Yes Jameir Ake, Cristopher Estimable, MD  pantoprazole (PROTONIX) 40 MG tablet Take 40 mg by mouth daily.   Yes [provider]  Pregabalin ER (LYRICA CR) 330 MG TB24 Take 330 mg by mouth at bedtime.   Yes [provider]  rOPINIRole (REQUIP) 0.5 MG tablet Take 0.5 mg by mouth at bedtime.   Yes [provider]  Vitamin D, Ergocalciferol, (DRISDOL) 50000 units CAPS capsule Take 50,000 Units by mouth every 7 (seven) days. Sundays   Yes [provider]  DULoxetine (CYMBALTA) 20 MG capsule Take 20 mg by mouth daily.    [provider]  Allergies as of 08/17/2017  . (No Known Allergies)    Family History  Problem Relation Age of Onset  . Cancer Father        kidney   . Hypertension Father   . Heart disease Father   . Hyperlipidemia Father   . Other Brother        Factor 13 def.  . Clotting disorder Brother   . Hyperlipidemia Paternal Grandfather   . Heart disease Paternal Grandfather   . Ulcers Brother   . Hypertension Other   . Inflammatory bowel disease Neg Hx   . Celiac disease Neg Hx   . Colon cancer Neg Hx     Social History   Socioeconomic History  . Marital status: Divorced     Spouse name: Not on file  . Number of children: 3  . Years of education: Not on file  . Highest education level: Not on file  Social Needs  . Financial resource strain: Not on file  . Food insecurity - worry: Not on file  . Food insecurity - inability: Not on file  . Transportation needs - medical: Not on file  . Transportation needs - non-medical: Not on file  Occupational History  . Not on file  Tobacco Use  . Smoking status: Current Every Day Smoker    Packs/day: 1.00    Years: 20.00    Pack years: 20.00    Types: Cigarettes  . Smokeless tobacco: Never Used  Substance and Sexual Activity  . Alcohol use: Yes    Comment: occassional  . Drug use: No  . Sexual activity: Yes    Birth control/protection: Surgical  Other Topics Concern  . Not on file  Social History Narrative  . Not on file    Review of Systems: See HPI, otherwise negative ROS  Physical Exam: BP 113/80   Temp 98.2 F (36.8 C)   Resp 15   Ht 5' 8"  (1.727 m)   Wt 148 lb (67.1 kg)   LMP 07/23/2014   SpO2 100%   BMI 22.50 kg/m  General:   Alert,  Well-developed, well-nourished, pleasant and cooperative in NAD Neck:  Supple; no masses or thyromegaly. No significant cervical adenopathy. Lungs:  Clear throughout to auscultation.   No wheezes, crackles, or rhonchi. No acute distress. Heart:  Regular rate and rhythm; no murmurs, clicks, rubs,  or gallops. Abdomen: Non-distended, normal bowel sounds.  Soft and nontender without appreciable mass or hepatosplenomegaly.  Pulses:  Normal pulses noted. Extremities:  Without clubbing or edema.  Impression:   Chronic nausea, vomiting, weight loss and diarrhea. Celiac serologies negative.  Recommendations: I have offered the patient a EGD and colonoscopy to further evaluate her symptoms.     Notice: This dictation was prepared with Dragon dictation along with smaller phrase technology. Any transcriptional errors that result from this process are unintentional  and may not be corrected upon review.  The risks, benefits, limitations, imponderables and alternatives regarding both EGD and colonoscopy have been reviewed with the patient. Questions have been answered. All parties agreeable.

## 2017-09-26 NOTE — Anesthesia Procedure Notes (Signed)
Procedure Name: MAC Date/Time: 09/26/2017 9:24 AM Performed by: Andree Elk Amy A, CRNA Pre-anesthesia Checklist: Patient identified, Emergency Drugs available, Suction available, Patient being monitored and Timeout performed Oxygen Delivery Method: Simple face mask

## 2017-09-26 NOTE — Anesthesia Postprocedure Evaluation (Signed)
Anesthesia Post Note  Patient: Kathryn Golden  Procedure(s) Performed: COLONOSCOPY WITH PROPOFOL (N/A ) ESOPHAGOGASTRODUODENOSCOPY (EGD) WITH PROPOFOL (N/A ) BIOPSY  Patient location during evaluation: PACU Anesthesia Type: MAC Level of consciousness: awake, oriented and patient cooperative Pain management: pain level controlled Vital Signs Assessment: post-procedure vital signs reviewed and stable Respiratory status: spontaneous breathing Cardiovascular status: stable Postop Assessment: no apparent nausea or vomiting Anesthetic complications: no     Last Vitals:  Vitals:   09/26/17 0915 09/26/17 0920  BP: 109/75 123/75  Resp: 19 10  Temp:    SpO2: 100% 100%    Last Pain:  Vitals:   09/26/17 0926  PainSc: 8                  Annette Liotta A

## 2017-09-26 NOTE — Op Note (Signed)
[facility] Patient Name: Kathryn Golden Procedure Date: 09/26/2017 9:38 AM MRN: [id number] Date of Birth: 07/22/1976 Attending MD: Gennette Pacobert Michael Cerria Randhawa , MD CSN: [id number] Age: [age] Admit Type: Outpatient Procedure:                Colonoscopy Indications:              Chronic diarrhea Providers:                Gennette Pacobert Michael Lennie Dunnigan, MD, Brain HiltsLurae Albert RN, RN,                            Dyann Ruddleonya Wilson, Edythe ClarityKelly Cox, Technician Referring MD:              Medicines:                Propofol per Anesthesia Complications:            No immediate complications. Estimated Blood Loss:     Estimated blood loss: none. Procedure:                Pre-Anesthesia Assessment:                           - Prior to the procedure, a History and Physical                            was performed, and patient medications and                            allergies were reviewed. The patient's tolerance of                            previous anesthesia was also reviewed. The risks                            and benefits of the procedure and the sedation                            options and risks were discussed with the patient.                            All questions were answered, and informed consent                            was obtained. Prior Anticoagulants: The patient has                            taken no previous anticoagulant or antiplatelet                            agents. ASA Grade Assessment: II - A patient with                            mild systemic disease. After reviewing the risks  and benefits, the patient was deemed in                            satisfactory condition to undergo the procedure.                           After obtaining informed consent, the colonoscope                            was passed under direct vision. Throughout the                            procedure, the patient's blood pressure, pulse, and                            oxygen saturations  were monitored continuously. The                            [id number] ([id number]) scope was introduced through                            the and advanced to the the terminal ileum. The                            colonoscopy was performed without difficulty. The                            patient tolerated the procedure well. The quality                            of the bowel preparation was adequate. The terminal                            ileum, ileocecal valve, appendiceal orifice, and                            rectum were photographed. Scope In: 9:41:19 AM Scope Out: 9:50:54 AM Scope Withdrawal Time: 0 hours 6 minutes 19 seconds  Total Procedure Duration: 0 hours 9 minutes 35 seconds  Findings:      The perianal and digital rectal examinations were normal.      A few medium-mouthed diverticula were found in the sigmoid colon and       descending colon. The distal 15 cm of terminal ileum appeared normal.      The exam was otherwise without abnormality on direct and retroflexion       views. Impression:               - Diverticulosis in the sigmoid colon and in the                            descending colon.                           - The examination was otherwise normal on direct  and retroflexion views.                           - No specimens collected. Moderate Sedation:      Moderate (conscious) sedation was personally administered by an       anesthesia professional. The following parameters were monitored: oxygen       saturation, heart rate, blood pressure, respiratory rate, EKG, adequacy       of pulmonary ventilation, and response to care. Total physician       intraservice time was 24 minutes. Recommendation:           - Patient has a contact number available for                            emergencies. The signs and symptoms of potential                            delayed complications were discussed with the                            patient.  Return to normal activities tomorrow.                            Written discharge instructions were provided to the                            patient.                           - Resume previous diet.                           - Repeat colonoscopy at age 26 for screening                            purposes. Procedure Code(s):        --- Professional ---                           [phone], Colonoscopy, flexible; diagnostic, including                            collection of specimen(s) by brushing or washing,                            when performed (separate procedure) Diagnosis Code(s):        --- Professional ---                           K52.9, Noninfective gastroenteritis and colitis,                            unspecified                           K57.30, Diverticulosis of large intestine without  perforation or abscess without bleeding CPT copyright 2016 American Medical Association. All rights reserved. The codes documented in this report are preliminary and upon coder review may  be revised to meet current compliance requirements. Gerrit Friendsobert M. Vanellope Passmore, MD Gennette Pacobert Michael Zoha Spranger, MD 09/26/2017 10:52:10 AM This report has been signed electronically. Number of Addenda: 0

## 2017-09-26 NOTE — Transfer of Care (Signed)
Immediate Anesthesia Transfer of Care Note  Patient: Kathryn Golden  Procedure(s) Performed: COLONOSCOPY WITH PROPOFOL (N/A ) ESOPHAGOGASTRODUODENOSCOPY (EGD) WITH PROPOFOL (N/A ) BIOPSY  Patient Location: PACU  Anesthesia Type:MAC  Level of Consciousness: awake, oriented and patient cooperative  Airway & Oxygen Therapy: Patient Spontanous Breathing  Post-op Assessment: Report given to RN and Post -op Vital signs reviewed and stable  Post vital signs: Reviewed and stable  Last Vitals:  Vitals:   09/26/17 0915 09/26/17 0920  BP: 109/75 123/75  Resp: 19 10  Temp:    SpO2: 100% 100%    Last Pain:  Vitals:   09/26/17 0926  PainSc: 8          Complications: No apparent anesthesia complications

## 2017-09-26 NOTE — Op Note (Signed)
[facility] Patient Name: Kathryn Golden Procedure Date: 09/26/2017 7:52 AM MRN: [id number] Date of Birth: 12-22-1975 Attending MD: Kathryn Pac , MD CSN: [id number] Age: 41 Admit Type: Outpatient Procedure:                Upper GI endoscopy Indications:              Nausea with vomiting Providers:                Kathryn Pac, MD, Criselda Peaches. Patsy Lager, RN,                            Dyann Ruddle, Edythe Clarity, Technician Referring MD:              Medicines:                Propofol per Anesthesia Complications:            No immediate complications. Estimated Blood Loss:     Estimated blood loss was minimal. Procedure:                Pre-Anesthesia Assessment:                           - Prior to the procedure, a History and Physical                            was performed, and patient medications and                            allergies were reviewed. The patient's tolerance of                            previous anesthesia was also reviewed. The risks                            and benefits of the procedure and the sedation                            options and risks were discussed with the patient.                            All questions were answered, and informed consent                            was obtained. Prior Anticoagulants: The patient has                            taken no previous anticoagulant or antiplatelet                            agents. ASA Grade Assessment: II - A patient with                            mild systemic disease. After reviewing the risks  and benefits, the patient was deemed in                            satisfactory condition to undergo the procedure.                           After obtaining informed consent, the endoscope was                            passed under direct vision. Throughout the                            procedure, the patient's blood pressure, pulse, and   oxygen saturations were monitored continuously. The                            [id number]) scope was introduced through the                            and advanced to the second part of duodenum. The                            upper GI endoscopy was accomplished without                            difficulty. The patient tolerated the procedure                            well. Scope In: 9:32:13 AM Scope Out: 9:36:30 AM Total Procedure Duration: 0 hours 4 minutes 17 seconds  Findings:      Esophagitis was found. distal esophageal erosions within 5 mm of the GE       junction. Patulous EG junction. No Barrett's epithelium seen.Tubular       esophagus otherwise appeared normal.      Multiple localized erosions were found in the gastric antrum. This was       biopsied with a cold forceps for histology. Estimated blood loss was       minimal.      The duodenal bulb, second portion of the duodenum and third portion of       the duodenum were normal. Impression:               - Erosive reflux Esophagitis. Patulous EG junction                           - Erosive gastropathy. Biopsied.                           - Normal duodenal bulb, second portion of the                            duodenum and third portion of the duodenum. Moderate Sedation:      Moderate (conscious) sedation was personally administered by an       anesthesia professional. The following parameters were monitored: oxygen       saturation, heart rate, blood pressure, respiratory  rate, EKG, adequacy       of pulmonary ventilation, and response to care. Total physician       intraservice time was 24 minutes. Recommendation:           - Patient has a contact number available for                            emergencies. The signs and symptoms of potential                            delayed complications were discussed with the                            patient. Return to normal activities tomorrow.                             Written discharge instructions were provided to the                            patient.                           - Resume previous diet.                           - Continue present medications. Increase Protonix                            to 40 mg twice daily -2 months                           - Await pathology results.                           - No repeat upper endoscopy.                           - Return to GI office in 2 months. See colonoscopy                            report. Procedure Code(s):        --- Professional ---                           [phone], Esophagogastroduodenoscopy, flexible,                            transoral; with biopsy, single or multiple Diagnosis Code(s):        --- Professional ---                           K20.9, Esophagitis, unspecified                           K31.89, Other diseases of stomach and duodenum  R11.2, Nausea with vomiting, unspecified CPT copyright 2016 American Medical Association. All rights reserved. The codes documented in this report are preliminary and upon coder review may  be revised to meet current compliance requirements. Kathryn Friendsobert M. Marke Goodwyn, MD Kathryn Pacobert Michael Jazmene Racz, MD 09/26/2017 9:54:14 AM This report has been signed electronically. Number of Addenda: 0

## 2017-09-26 NOTE — Discharge Instructions (Signed)
Gastroesophageal Reflux Disease, Adult Normally, food travels down the esophagus and stays in the stomach to be digested. If a person has gastroesophageal reflux disease (GERD), food and stomach acid move back up into the esophagus. When this happens, the esophagus becomes sore and swollen (inflamed). Over time, GERD can make small holes (ulcers) in the lining of the esophagus. Follow these instructions at home: Diet  Follow a diet as told by your doctor. You may need to avoid foods and drinks such as: ? Coffee and tea (with or without caffeine). ? Drinks that contain alcohol. ? Energy drinks and sports drinks. ? Carbonated drinks or sodas. ? Chocolate and cocoa. ? Peppermint and mint flavorings. ? Garlic and onions. ? Horseradish. ? Spicy and acidic foods, such as peppers, chili powder, curry powder, vinegar, hot sauces, and BBQ sauce. ? Citrus fruit juices and citrus fruits, such as oranges, lemons, and limes. ? Tomato-based foods, such as red sauce, chili, salsa, and pizza with red sauce. ? Fried and fatty foods, such as donuts, french fries, potato chips, and high-fat dressings. ? High-fat meats, such as hot dogs, rib eye steak, sausage, ham, and bacon. ? High-fat dairy items, such as whole milk, butter, and cream cheese.  Eat small meals often. Avoid eating large meals.  Avoid drinking large amounts of liquid with your meals.  Avoid eating meals during the 2-3 hours before bedtime.  Avoid lying down right after you eat.  Do not exercise right after you eat. General instructions  Pay attention to any changes in your symptoms.  Take over-the-counter and prescription medicines only as told by your doctor. Do not take aspirin, ibuprofen, or other NSAIDs unless your doctor says it is okay.  Do not use any tobacco products, including cigarettes, chewing tobacco, and e-cigarettes. If you need help quitting, ask your doctor.  Wear loose clothes. Do not wear anything tight around  your waist.  Raise (elevate) the head of your bed about 6 inches (15 cm).  Try to lower your stress. If you need help doing this, ask your doctor.  If you are overweight, lose an amount of weight that is healthy for you. Ask your doctor about a safe weight loss goal.  Keep all follow-up visits as told by your doctor. This is important. Contact a doctor if:  You have new symptoms.  You lose weight and you do not know why it is happening.  You have trouble swallowing, or it hurts to swallow.  You have wheezing or a cough that keeps happening.  Your symptoms do not get better with treatment.  You have a hoarse voice. Get help right away if:  You have pain in your arms, neck, jaw, teeth, or back.  You feel sweaty, dizzy, or light-headed.  You have chest pain or shortness of breath.  You throw up (vomit) and your throw up looks like blood or coffee grounds.  You pass out (faint).  Your poop (stool) is bloody or black.  You cannot swallow, drink, or eat. This information is not intended to replace advice given to you by your health care provider. Make sure you discuss any questions you have with your health care provider. Document Released: 03/27/2008 Document Revised: 03/16/2016 Document Reviewed: 02/03/2015 Elsevier Interactive Patient Education  2018 [employer]  Colonoscopy Discharge Instructions  Read the instructions outlined below and refer to this sheet in the next few weeks. These discharge instructions provide you with general information on caring for yourself after you leave the hospital.  Your doctor may also give you specific instructions. While your treatment has been planned according to the most current medical practices available, unavoidable complications occasionally occur. If you have any problems or questions after discharge, call Dr. Jena Gauss at [phone]. ACTIVITY  You may resume your regular activity, but move at a slower pace for the next 24 hours.    Take frequent rest periods for the next 24 hours.   Walking will help get rid of the air and reduce the bloated feeling in your belly (abdomen).   No driving for 24 hours (because of the medicine (anesthesia) used during the test).    Do not sign any important legal documents or operate any machinery for 24 hours (because of the anesthesia used during the test).  NUTRITION  Drink plenty of fluids.   You may resume your normal diet as instructed by your doctor.   Begin with a light meal and progress to your normal diet. Heavy or fried foods are harder to digest and may make you feel sick to your stomach (nauseated).   Avoid alcoholic beverages for 24 hours or as instructed.  MEDICATIONS  You may resume your normal medications unless your doctor tells you otherwise.  WHAT YOU CAN EXPECT TODAY  Some feelings of bloating in the abdomen.   Passage of more gas than usual.   Spotting of blood in your stool or on the toilet paper.  IF YOU HAD POLYPS REMOVED DURING THE COLONOSCOPY:  No aspirin products for 7 days or as instructed.   No alcohol for 7 days or as instructed.   Eat a soft diet for the next 24 hours.  FINDING OUT THE RESULTS OF YOUR TEST Not all test results are available during your visit. If your test results are not back during the visit, make an appointment with your caregiver to find out the results. Do not assume everything is normal if you have not heard from your caregiver or the medical facility. It is important for you to follow up on all of your test results.  SEEK IMMEDIATE MEDICAL ATTENTION IF:  You have more than a spotting of blood in your stool.   Your belly is swollen (abdominal distention).   You are nauseated or vomiting.   You have a temperature over 101.   You have abdominal pain or discomfort that is severe or gets worse throughout the day.  EGD Discharge instructions Please read the instructions outlined below and refer to this sheet in  the next few weeks. These discharge instructions provide you with general information on caring for yourself after you leave the hospital. Your doctor may also give you specific instructions. While your treatment has been planned according to the most current medical practices available, unavoidable complications occasionally occur. If you have any problems or questions after discharge, please call your doctor. ACTIVITY  You may resume your regular activity but move at a slower pace for the next 24 hours.   Take frequent rest periods for the next 24 hours.   Walking will help expel (get rid of) the air and reduce the bloated feeling in your abdomen.   No driving for 24 hours (because of the anesthesia (medicine) used during the test).   You may shower.   Do not sign any important legal documents or operate any machinery for 24 hours (because of the anesthesia used during the test).  NUTRITION  Drink plenty of fluids.   You may resume your normal diet.   Begin  with a light meal and progress to your normal diet.   Avoid alcoholic beverages for 24 hours or as instructed by your caregiver.  MEDICATIONS  You may resume your normal medications unless your caregiver tells you otherwise.  WHAT YOU CAN EXPECT TODAY  You may experience abdominal discomfort such as a feeling of fullness or gas pains.  FOLLOW-UP  Your doctor will discuss the results of your test with you.  SEEK IMMEDIATE MEDICAL ATTENTION IF ANY OF THE FOLLOWING OCCUR:  Excessive nausea (feeling sick to your stomach) and/or vomiting.   Severe abdominal pain and distention (swelling).   Trouble swallowing.   Temperature over 101 F (37.8 C).   Rectal bleeding or vomiting of blood.    GERD information provided  Increase Protonix to 40 mg twice daily  Trial of Levsin 0.125 mg 1 under tongue before meals and at bedtime as needed for abdominal cramps and diarrhea  Further recommendations to follow pending review  of pathology report  Office visit with us in 2 months

## 2017-09-26 NOTE — Anesthesia Preprocedure Evaluation (Signed)
Anesthesia Evaluation  Patient identified by MRN, date of birth, ID band Patient awake    Airway Mallampati: I  TM Distance: >3 FB     Dental  (+) Teeth Intact   Pulmonary Current Smoker,    Pulmonary exam normal breath sounds clear to auscultation       Cardiovascular Exercise Tolerance: Good hypertension, Normal cardiovascular exam Rhythm:Regular Rate:Normal  Normal sinus rhythm Septal infarct , age undetermined Abnormal ECG   (Nov 2018)   Neuro/Psych Anxiety Depression  Neuromuscular disease (fibromyalgia)    GI/Hepatic   Endo/Other    Renal/GU      Musculoskeletal  (+) Arthritis , Fibromyalgia -  Abdominal Normal abdominal exam  (+)   Peds  Hematology   Anesthesia Other Findings   Reproductive/Obstetrics                             Anesthesia Physical Anesthesia Plan  ASA: III  Anesthesia Plan: MAC   Post-op Pain Management:    Induction:   PONV Risk Score and Plan:   Airway Management Planned: Nasal Cannula  Additional Equipment:   Intra-op Plan:   Post-operative Plan:   Informed Consent: I have reviewed the patients History and Physical, chart, labs and discussed the procedure including the risks, benefits and alternatives for the proposed anesthesia with the patient or authorized representative who has indicated his/her understanding and acceptance.   Dental advisory given  Plan Discussed with: CRNA  Anesthesia Plan Comments:         Anesthesia Quick Evaluation

## 2017-09-27 ENCOUNTER — Telehealth: Payer: Self-pay | Admitting: Internal Medicine

## 2017-09-27 ENCOUNTER — Telehealth: Payer: Self-pay

## 2017-09-27 NOTE — Telephone Encounter (Signed)
[facility] ( phone [phone] and fax number [phone].  She said pt called them concerned about her kidney functions, she saw something on my chart that indicated she might have some kidney problems.  I reviewed the labs and told her we have not got sign off on the last labs but I noticed the COMMENT under the BMP that I felt the pt might have seen, and told Verlon AuLeslie what it was.  She said they had checked a BMP on 09/11/2017 and it was normal.  She asked for the OV note to be faxed to her and I am also faxing a copy of the labs.

## 2017-09-27 NOTE — Telephone Encounter (Signed)
Agree. Her renal function was normal on her pre-op labs. Maybe patient saw the following that is posted with the lab result:  "The eGFR has been calculated using the CKD EPI equation.  This calculation has not been validated in all clinical situations.  eGFR's persistently <60 mL/min signify possible Chronic Kidney  Disease. "    

## 2017-09-27 NOTE — Telephone Encounter (Signed)
Levsin just for chronic diarrhea. It may also help with any abdominal cramps that she may experience

## 2017-09-27 NOTE — Telephone Encounter (Signed)
[phone]  Please call patient, she was on my chart and saw something that upset her regarding her kidneys.  Also asking about the procedures she had yesterday

## 2017-09-27 NOTE — Telephone Encounter (Signed)
Spoke with pt and she had a question in reference to her Levsin given after her procedure yesterday. Levsin is making pt too sleep. Pt reports no stomach pain.  Looked up pts discharge AVS and Levsin is to be taken as a trial one tab twice daily as needed for stomach pain. Pt is aware that Levsin is to be taken as needed. Pt said she will try med at night only at this time since she has to take care of her children.

## 2017-09-27 NOTE — Telephone Encounter (Signed)
Noted, pt notified.  

## 2017-09-27 NOTE — Telephone Encounter (Signed)
Pt is aware her renal function is normal.

## 2017-10-01 ENCOUNTER — Encounter: Payer: Self-pay | Admitting: Internal Medicine

## 2017-10-02 ENCOUNTER — Encounter (HOSPITAL_COMMUNITY): Payer: Self-pay | Admitting: Internal Medicine

## 2017-10-30 ENCOUNTER — Encounter: Payer: Self-pay | Admitting: Internal Medicine

## 2017-11-01 DIAGNOSIS — M4727 Other spondylosis with radiculopathy, lumbosacral region: Secondary | ICD-10-CM | POA: Diagnosis not present

## 2017-11-01 DIAGNOSIS — M5127 Other intervertebral disc displacement, lumbosacral region: Secondary | ICD-10-CM | POA: Diagnosis not present

## 2017-11-01 DIAGNOSIS — M4722 Other spondylosis with radiculopathy, cervical region: Secondary | ICD-10-CM | POA: Diagnosis not present

## 2017-12-21 ENCOUNTER — Ambulatory Visit: Payer: Medicaid Other | Admitting: Internal Medicine

## 2017-12-21 ENCOUNTER — Encounter: Payer: Self-pay | Admitting: Internal Medicine

## 2017-12-21 ENCOUNTER — Telehealth: Payer: Self-pay

## 2017-12-21 VITALS — BP 127/76 | HR 114 | Temp 97.3°F | Ht 68.0 in | Wt 145.8 lb

## 2017-12-21 DIAGNOSIS — K219 Gastro-esophageal reflux disease without esophagitis: Secondary | ICD-10-CM

## 2017-12-21 DIAGNOSIS — K588 Other irritable bowel syndrome: Secondary | ICD-10-CM | POA: Diagnosis not present

## 2017-12-21 MED ORDER — RABEPRAZOLE SODIUM 20 MG PO TBEC: 20.0000 mg | DELAYED_RELEASE_TABLET | Freq: Every day | ORAL | 11 refills | Status: AC

## 2017-12-21 MED ORDER — DICYCLOMINE HCL 10 MG PO CAPS: 10.0000 mg | ORAL_CAPSULE | Freq: Three times a day (TID) | ORAL | 11 refills | Status: DC

## 2017-12-21 NOTE — Telephone Encounter (Signed)
TSH results are on providers RMR desk from [facility].

## 2017-12-21 NOTE — Patient Instructions (Addendum)
Stop Protonix; trial of Aciphex 20 mg daily (disp #30 with 11 refills)  Bentyl 10 mg orally before meals and a bedtime as needed (disp 60 ) with 11 refills  Avoid ASA / NSAIDs as much as possible  Office visit in 3 months  Need TSH as previously recommended

## 2017-12-21 NOTE — Progress Notes (Signed)
Primary Care Physician:  Jani Gravel, MD Primary Gastroenterologist:  Dr. Gala Romney  Pre-Procedure History & Physical: HPI:  Kathryn Golden is a 42 y.o. female here for follow-up of GERD/dyspepsia and intermittent cramps and diarrhea. Known H. Pylori gastritis on recent EGD. Reflux esophagitis found. Increase Protonix to twice a day. States it has not helped. Continues to have intermittent postprandial abdominal cramps and diarrhea. Celiac screen negative.  TSH normal.  Patient wants try rabeprazole as both her brother and father have reflux and it works very well for them.  Past Medical History:  Diagnosis Date  . Abnormal Pap smear   . ADHD (attention deficit hyperactivity disorder)   . Anemia   . Anxiety 03/27/2013  . Breast lump 03/27/2013  . Degenerative cervical disc   . Depression   . Diverticulosis   . Endometriosis   . Fibromyalgia 03/27/2013  . HPV (human papilloma virus) infection   . Hx of degenerative disc disease   . Migraine   . PIH (pregnancy induced hypertension)    in pregnancy only  . Sciatic nerve pain, right     Past Surgical History:  Procedure Laterality Date  . ABDOMINAL HYSTERECTOMY     partial  . BIOPSY  09/26/2017   Procedure: BIOPSY;  Surgeon: Daneil Dolin, MD;  [facility];  Service: Endoscopy;;  gastric  . COLONOSCOPY WITH PROPOFOL N/A 09/26/2017   Procedure: COLONOSCOPY WITH PROPOFOL;  Surgeon: Daneil Dolin, MD;  [facility];  Service: Endoscopy;  Laterality: N/A;  9:30am  . DILATION AND CURETTAGE OF UTERUS    . dilation and curretage    . ESOPHAGOGASTRODUODENOSCOPY (EGD) WITH PROPOFOL N/A 09/26/2017   Procedure: ESOPHAGOGASTRODUODENOSCOPY (EGD) WITH PROPOFOL;  Surgeon: Daneil Dolin, MD;  [facility];  Service: Endoscopy;  Laterality: N/A;  . HAND SURGERY Right    tendon repair  . TUBAL LIGATION  07/24/2011   Procedure: POST PARTUM TUBAL LIGATION;  Surgeon: Mora Bellman, MD;  Location: [facility];  Service:  Gynecology;  Laterality: Bilateral;  . VAGINAL HYSTERECTOMY N/A 09/02/2014   Procedure: HYSTERECTOMY VAGINAL;  Surgeon: Florian Buff, MD;  Location: [facility];  Service: Gynecology;  Laterality: N/A;    Prior to Admission medications   Medication Sig Start Date End Date Taking? Authorizing Provider  aspirin-acetaminophen-caffeine (EXCEDRIN MIGRAINE) [id number] MG per tablet Take 2 tablets by mouth daily as needed for headache.    Yes [provider]  baclofen (LIORESAL) 10 MG tablet Take 10 mg by mouth every 8 (eight) hours as needed for muscle spasms.    Yes [provider]  carisoprodol (SOMA) 350 MG tablet Take 350 mg by mouth daily as needed for muscle spasms.   Yes [provider]  clonazePAM (KLONOPIN) 1 MG tablet Take 1 mg by mouth 2 (two) times daily as needed for anxiety.    Yes [provider]  DULoxetine (CYMBALTA) 20 MG capsule Take 20 mg by mouth daily.   Yes [provider]  fluticasone (FLONASE) 50 MCG/ACT nasal spray Place 2 sprays into both nostrils as needed.    Yes [provider]  HYDROcodone-acetaminophen (NORCO) 10-325 MG per tablet Take 1 tablet by mouth 3 (three) times daily as needed for moderate pain.   Yes [provider]  ibuprofen (ADVIL,MOTRIN) 600 MG tablet Take 600 mg by mouth every 6 (six) hours as needed for moderate pain.   Yes [provider]  pantoprazole (PROTONIX) 40 MG tablet Take 40  mg by mouth daily.   Yes [provider]  pregabalin (LYRICA) 200 MG capsule Take 200 mg by mouth at bedtime.   Yes [provider]  rOPINIRole (REQUIP) 0.5 MG tablet Take 0.5 mg by mouth at bedtime.   Yes [provider]  Vitamin D, Ergocalciferol, (DRISDOL) 50000 units CAPS capsule Take 50,000 Units by mouth every 7 (seven) days. Sundays   Yes [provider]  celecoxib (CELEBREX) 100 MG capsule Take 100 mg by mouth daily.    [provider]  loratadine  (CLARITIN) 10 MG tablet Take 10 mg by mouth daily.    [provider]  Na Sulfate-K Sulfate-Mg Sulf (SUPREP BOWEL PREP KIT) 17.5-3.13-1.6 GM/177ML SOLN Take 1 kit by mouth as directed. Patient not taking: Reported on 12/21/2017 08/17/17   Rourk, Robert M, MD  Pregabalin ER (LYRICA CR) 330 MG TB24 Take 330 mg by mouth at bedtime.    [provider]    Allergies as of 12/21/2017  . (No Known Allergies)    Family History  Problem Relation Age of Onset  . Cancer Father        kidney   . Hypertension Father   . Heart disease Father   . Hyperlipidemia Father   . Other Brother        Factor 13 def.  . Clotting disorder Brother   . Hyperlipidemia Paternal Grandfather   . Heart disease Paternal Grandfather   . Ulcers Brother   . Hypertension Other   . Inflammatory bowel disease Neg Hx   . Celiac disease Neg Hx   . Colon cancer Neg Hx     Social History   Socioeconomic History  . Marital status: Divorced    Spouse name: Not on file  . Number of children: 3  . Years of education: Not on file  . Highest education level: Not on file  Social Needs  . Financial resource strain: Not on file  . Food insecurity - worry: Not on file  . Food insecurity - inability: Not on file  . Transportation needs - medical: Not on file  . Transportation needs - non-medical: Not on file  Occupational History  . Not on file  Tobacco Use  . Smoking status: Current Every Day Smoker    Packs/day: 1.00    Years: 20.00    Pack years: 20.00    Types: Cigarettes  . Smokeless tobacco: Never Used  Substance and Sexual Activity  . Alcohol use: No    Frequency: Never  . Drug use: No  . Sexual activity: Yes    Birth control/protection: Surgical  Other Topics Concern  . Not on file  Social History Narrative  . Not on file    Review of Systems: See HPI, otherwise negative ROS  Physical Exam: BP 127/76   Pulse (!) 114   Temp (!) 97.3 F (36.3 C) (Oral)   Ht 5' 8" (1.727 m)   Wt  145 lb 12.8 oz (66.1 kg)   LMP 07/23/2014   BMI 22.17 kg/m  General:   Alert,   pleasant and cooperative in NAD Neck:  Supple; no masses or thyromegaly. No significant cervical adenopathy. Lungs:  Clear throughout to auscultation.   No wheezes, crackles, or rhonchi. No acute distress. Heart:  Regular rate and rhythm; no murmurs, clicks, rubs,  or gallops. Abdomen: Non-distended, normal bowel sounds.  Soft and nontender without appreciable mass or hepatosplenomegaly.  Pulses:  Normal pulses noted. Extremities:  Without clubbing or edema.    Impression:  History of GERD/reflux esophagitis/dyspepsia. EGD of findings as outlined above. Bowel symptoms likely related to irritable bowel syndrome. There may be something to the observation that she is not doing as well with Protonix and her family does better with rabeprazole.  Not mentioned above, Levsin has not helped her bowel symptoms.  Recommendations:Stop Protonix; trial of Aciphex 20 mg daily (disp #30 with 11 refills)  Bentyl 10 mg orally before meals and a bedtime as needed (disp 60 ) with 11 refills  Avoid ASA / NSAIDs as much as possible  Office visit in 3 months      Notice: This dictation was prepared with Dragon dictation along with smaller phrase technology. Any transcriptional errors that result from this process are unintentional and may not be corrected upon review.

## 2017-12-23 NOTE — Telephone Encounter (Signed)
tsh  ormal

## 2017-12-24 NOTE — Telephone Encounter (Signed)
Lmom, waiting on a return call.  

## 2017-12-26 NOTE — Telephone Encounter (Signed)
Lmom, waiting on a return call.  

## 2017-12-26 NOTE — Telephone Encounter (Signed)
Pt notified of results

## 2017-12-27 ENCOUNTER — Ambulatory Visit: Payer: Medicaid Other | Admitting: Advanced Practice Midwife

## 2017-12-27 ENCOUNTER — Encounter: Payer: Self-pay | Admitting: Advanced Practice Midwife

## 2017-12-27 VITALS — BP 104/66 | HR 98 | Ht 68.0 in | Wt 148.0 lb

## 2017-12-27 DIAGNOSIS — Z113 Encounter for screening for infections with a predominantly sexual mode of transmission: Secondary | ICD-10-CM | POA: Diagnosis not present

## 2017-12-27 DIAGNOSIS — B373 Candidiasis of vulva and vagina: Secondary | ICD-10-CM

## 2017-12-27 DIAGNOSIS — B3731 Acute candidiasis of vulva and vagina: Secondary | ICD-10-CM

## 2017-12-27 MED ORDER — FLUCONAZOLE 150 MG PO TABS: ORAL_TABLET | ORAL | 2 refills | Status: DC

## 2017-12-27 NOTE — Progress Notes (Signed)
[facility] Visit  Patient name: Kathryn Golden MRN [id number]  Date of birth: 1976/03/14  CC & HPI:  Kathryn Golden is a 42 y.o.  female presenting today for STD screen and vaginal itch (just finished abx)  Heard that ex has HSV. Wants to be tested for everything. Discussed 50% false + rate w/HSV, wants it anyway. Has oral cold sores.   Pertinent History Reviewed:  Medical & Surgical Hx:   Past Medical History:  Diagnosis Date  . Abnormal Pap smear   . ADHD (attention deficit hyperactivity disorder)   . Anemia   . Anxiety 03/27/2013  . Breast lump 03/27/2013  . Degenerative cervical disc   . Depression   . Diverticulosis   . Endometriosis   . Fibromyalgia 03/27/2013  . HPV (human papilloma virus) infection   . Hx of degenerative disc disease   . Migraine   . PIH (pregnancy induced hypertension)    in pregnancy only  . Sciatic nerve pain, right    Past Surgical History:  Procedure Laterality Date  . ABDOMINAL HYSTERECTOMY     partial  . BIOPSY  09/26/2017   Procedure: BIOPSY;  Surgeon: Daneil Dolin, MD;  [facility];  Service: Endoscopy;;  gastric  . COLONOSCOPY WITH PROPOFOL N/A 09/26/2017   Procedure: COLONOSCOPY WITH PROPOFOL;  Surgeon: Daneil Dolin, MD;  [facility];  Service: Endoscopy;  Laterality: N/A;  9:30am  . DILATION AND CURETTAGE OF UTERUS    . dilation and curretage    . ESOPHAGOGASTRODUODENOSCOPY (EGD) WITH PROPOFOL N/A 09/26/2017   Procedure: ESOPHAGOGASTRODUODENOSCOPY (EGD) WITH PROPOFOL;  Surgeon: Daneil Dolin, MD;  [facility];  Service: Endoscopy;  Laterality: N/A;  . HAND SURGERY Right    tendon repair  . TUBAL LIGATION  07/24/2011   Procedure: POST PARTUM TUBAL LIGATION;  Surgeon: Mora Bellman, MD;  Location: [facility];  Service: Gynecology;  Laterality: Bilateral;  . VAGINAL HYSTERECTOMY N/A 09/02/2014   Procedure: HYSTERECTOMY VAGINAL;  Surgeon: Florian Buff, MD;  Location: [facility];  Service: Gynecology;   Laterality: N/A;   Family History  Problem Relation Age of Onset  . Cancer Father        kidney   . Hypertension Father   . Heart disease Father   . Hyperlipidemia Father   . Other Brother        Factor 13 def.  . Clotting disorder Brother   . Hyperlipidemia Paternal Grandfather   . Heart disease Paternal Grandfather   . Ulcers Brother   . Hypertension Other   . Inflammatory bowel disease Neg Hx   . Celiac disease Neg Hx   . Colon cancer Neg Hx     Current Outpatient Medications:  .  aspirin-acetaminophen-caffeine (EXCEDRIN MIGRAINE) 250-250-65 MG per tablet, Take 2 tablets by mouth daily as needed for headache. , Disp: , Rfl:  .  baclofen (LIORESAL) 10 MG tablet, Take 10 mg by mouth every 8 (eight) hours as needed for muscle spasms. , Disp: , Rfl:  .  clonazePAM (KLONOPIN) 1 MG tablet, Take 1 mg by mouth 2 (two) times daily as needed for anxiety. , Disp: , Rfl:  .  dicyclomine (BENTYL) 10 MG capsule, Take 1 capsule (10 mg total) by mouth 4 (four) times daily -  before meals and at bedtime., Disp: 60 capsule, Rfl: 11 .  DULoxetine (CYMBALTA) 20 MG capsule, Take 20 mg by mouth daily., Disp: , Rfl:  .  fluticasone (  FLONASE) 50 MCG/ACT nasal spray, Place 2 sprays into both nostrils as needed. , Disp: , Rfl:  .  HYDROcodone-acetaminophen (NORCO) 10-325 MG per tablet, Take 1 tablet by mouth 3 (three) times daily as needed for moderate pain., Disp: , Rfl:  .  ibuprofen (ADVIL,MOTRIN) 600 MG tablet, Take 600 mg by mouth every 6 (six) hours as needed for moderate pain., Disp: , Rfl:  .  loratadine (CLARITIN) 10 MG tablet, Take 10 mg by mouth daily., Disp: , Rfl:  .  pantoprazole (PROTONIX) 40 MG tablet, Take 40 mg by mouth daily., Disp: , Rfl:  .  pregabalin (LYRICA) 200 MG capsule, Take 200 mg by mouth at bedtime., Disp: , Rfl:  .  Pregabalin ER (LYRICA CR) 330 MG TB24, Take 330 mg by mouth at bedtime., Disp: , Rfl:  .  RABEprazole (ACIPHEX) 20 MG tablet, Take 1 tablet (20 mg total) by  mouth daily., Disp: 30 tablet, Rfl: 11 .  rizatriptan (MAXALT) 10 MG tablet, Take 10 mg by mouth as needed for migraine. May repeat in 2 hours if needed, Disp: , Rfl:  .  rOPINIRole (REQUIP) 0.5 MG tablet, Take 0.5 mg by mouth at bedtime., Disp: , Rfl:  .  Vitamin D, Ergocalciferol, (DRISDOL) 50000 units CAPS capsule, Take 50,000 Units by mouth every 7 (seven) days. Sundays, Disp: , Rfl:  .  carisoprodol (SOMA) 350 MG tablet, Take 350 mg by mouth daily as needed for muscle spasms., Disp: , Rfl:  .  celecoxib (CELEBREX) 100 MG capsule, Take 100 mg by mouth daily., Disp: , Rfl:  .  Na Sulfate-K Sulfate-Mg Sulf (SUPREP BOWEL PREP KIT) 17.5-3.13-1.6 GM/177ML SOLN, Take 1 kit by mouth as directed. (Patient not taking: Reported on 12/21/2017), Disp: 1 Bottle, Rfl: 0 Social History: Reviewed -  reports that she has been smoking cigarettes.  She has a 20.00 pack-year smoking history. she has never used smokeless tobacco.  Review of Systems:   Constitutional: Negative for fever and chills Eyes: Negative for visual disturbances Respiratory: Negative for shortness of breath, dyspnea Cardiovascular: Negative for chest pain or palpitations  Gastrointestinal: Negative for vomiting, diarrhea and constipation; no abdominal pain Genitourinary: Negative for dysuria and urgency, vaginal irritation or itching Musculoskeletal: Negative for back pain, joint pain, myalgias  Neurological: Negative for dizziness and headaches    Objective Findings:    Physical Examination: General appearance - well appearing, and in no distress Mental status - alert, oriented to person, place, and time Chest:  Normal respiratory effort Heart - normal rate and regular rhythm Abdomen:  Soft, nontender Pelvic: Yeast discharge Musculoskeletal:  Normal range of motion without pain Extremities:  No edema    No results found for this or any previous visit (from the past 24 hour(s)).    Assessment & Plan:  A:   Yeast, STD  screen P:   Orders Placed This Encounter  Procedures  . GC/Chlamydia Probe Amp  . Trichomonas vaginalis, RNA  . HIV antibody  . RPR  . Hepatitis c antibody (reflex)  . Hepatitis B Surface AntiGEN  . HSV 2 antibody, IgG      No Follow-up on file.  Joaquim Lai Cresenzo-Dishmon CNM 12/27/2017 2:00 PM

## 2017-12-28 ENCOUNTER — Encounter: Payer: Self-pay | Admitting: Advanced Practice Midwife

## 2017-12-28 LAB — HEPATITIS B SURFACE ANTIGEN: HEP B S AG: NEGATIVE

## 2017-12-28 LAB — RPR: RPR Ser Ql: NONREACTIVE

## 2017-12-28 LAB — HSV 2 ANTIBODY, IGG

## 2017-12-28 LAB — HCV COMMENT:

## 2017-12-28 LAB — HIV ANTIBODY (ROUTINE TESTING W REFLEX): HIV Screen 4th Generation wRfx: NONREACTIVE

## 2017-12-28 LAB — HEPATITIS C ANTIBODY (REFLEX): HCV Ab: <0.1 s/co ratio (ref 0.0–0.9)

## 2017-12-29 LAB — TRICHOMONAS VAGINALIS, PROBE AMP: TRICH VAG BY NAA: NEGATIVE

## 2017-12-29 LAB — GC/CHLAMYDIA PROBE AMP
Chlamydia trachomatis, NAA: NEGATIVE
NEISSERIA GONORRHOEAE BY PCR: NEGATIVE

## 2018-01-27 IMAGING — XA Imaging study
2 series · 2 of 2 positions shown · non-contrast
Comparison: none

CLINICAL DATA: Lumbosacral spondylosis without myelopathy. Low back
pain and left lower extremity radicular pain. Left paracentral disc
protrusion at L5-S1 on an MRI from 8500. A recent CT of the abdomen
and pelvis also demonstrates a central disc extrusion at L4-5
resulting in spinal stenosis and lateral recess stenosis.

[Series 1: ortho standard · 1 of 1 slices shown (1 of 2)]
[im 1/1]
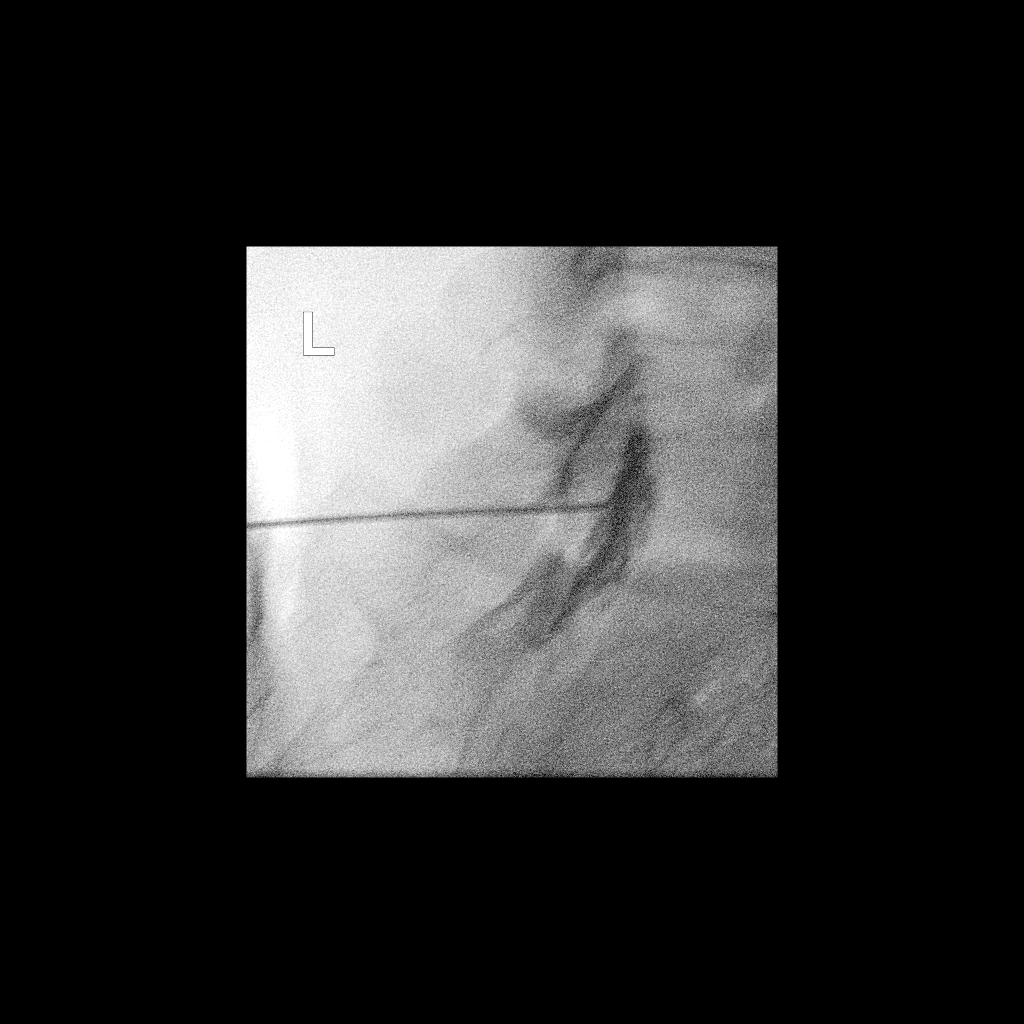

[Series 2: ortho standard · 1 of 1 slices shown (2 of 2)]
[im 1/1]
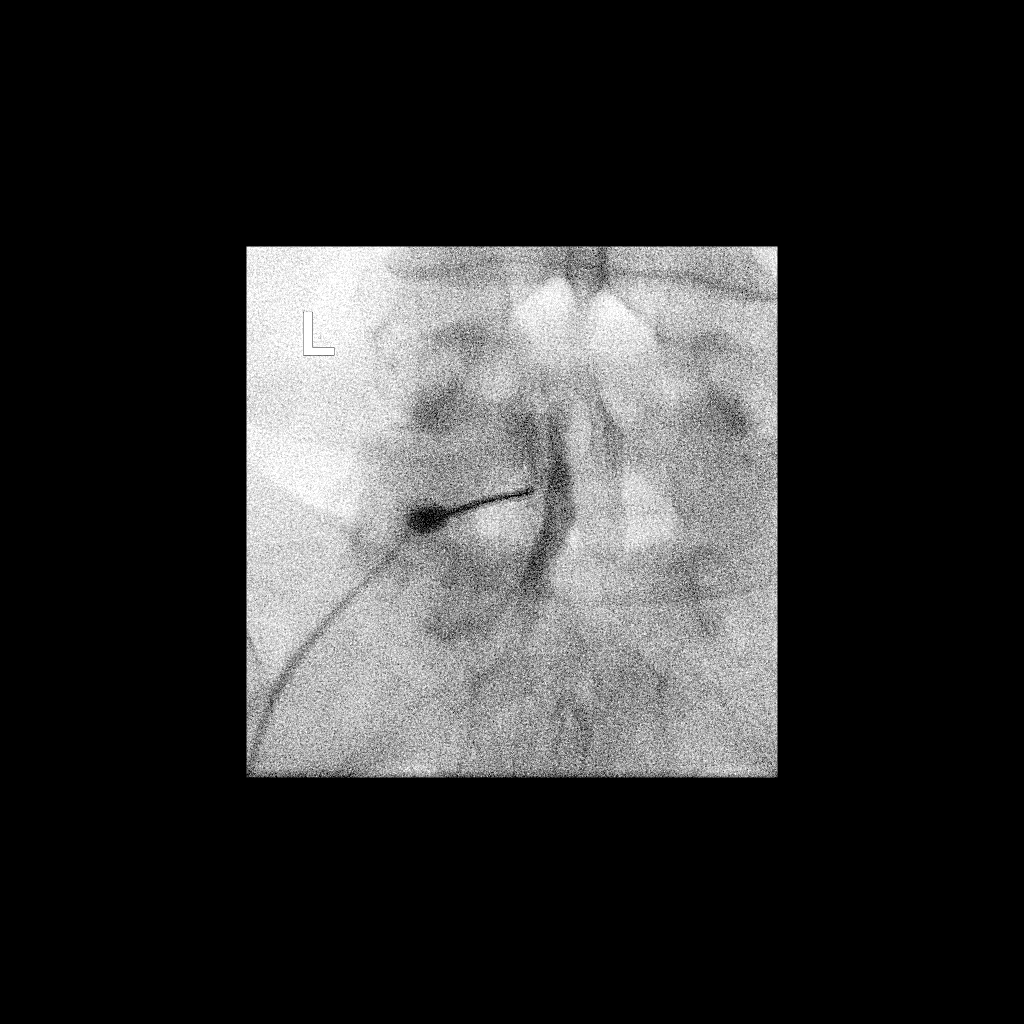

[2 of 2 positions shown; findings below may reference images not displayed]

FLUOROSCOPY TIME:  Radiation Exposure Index (as provided by the
fluoroscopic device): 7.22 microGray*m^2

Fluoroscopy Time (in minutes and seconds):  4 seconds

PROCEDURE:
The procedure, risks, benefits, and alternatives were explained to
the patient. Questions regarding the procedure were encouraged and
answered. The patient understands and consents to the procedure.

LUMBAR EPIDURAL INJECTION:

An interlaminar approach was performed on the left at L5-S1. The
overlying skin was cleansed and anesthetized. A 3.5 inch 20 gauge
epidural needle was advanced using loss-of-resistance technique.

DIAGNOSTIC EPIDURAL INJECTION:

Injection of Isovue-M 200 shows a good epidural pattern with spread
above and below the level of needle placement, primarily on the
left. No vascular opacification is seen.

THERAPEUTIC EPIDURAL INJECTION:

120 mg of Depo-Medrol mixed with 3 mL of 1% lidocaine were
instilled. The procedure was well-tolerated, and the patient was
discharged thirty minutes following the injection in good condition.

COMPLICATIONS:
None
IMPRESSION: Technically successful lumbar interlaminar epidural injection on the
left at L5-S1.

## 2018-02-13 ENCOUNTER — Other Ambulatory Visit: Payer: Self-pay | Admitting: Neurological Surgery

## 2018-02-26 NOTE — Pre-Procedure Instructions (Signed)
Kathryn Golden  02/26/2018      [facility] - [place], [place] - [address] [place] [address] Phone: [phone] Fax: [phone]    Your procedure is scheduled on  Thursday 03/07/18  Report to [facility] Admitting at 530 A.M.  Call this number if you have problems the morning of surgery:  [phone]   Remember:  Do not eat food or drink liquids after midnight.  Take these medicines the morning of surgery with A SIP OF WATER - BACLOFEN IF NEEDED, FIORICET IF NEEDED, CLONAZEPAM IF NEEDED, DULOXETINE (CYMBALTA), HYDROCODONE IF NEEDED, PANTOPRAZOLE (PROTONIX), ACIPHEX  7 days prior to surgery STOP taking any Aspirin(unless otherwise instructed by your surgeon), Aleve, Naproxen, Ibuprofen, Motrin, CELEBREX/ CELECOXIB, Advil, Goody's, BC's, all herbal medications, fish oil, and all vitamins   Do not wear jewelry, make-up or nail polish.  Do not wear lotions, powders, or perfumes, or deodorant.  Do not shave 48 hours prior to surgery.  Men may shave face and neck.  Do not bring valuables to the hospital.  [facility] is not responsible for any belongings or valuables.  Contacts, dentures or bridgework may not be worn into surgery.  Leave your suitcase in the car.  After surgery it may be brought to your room.  For patients admitted to the hospital, discharge time will be determined by your treatment team.  Patients discharged the day of surgery will not be allowed to drive home.   Name and phone number of your driver:    Special instructions:  [place] - Preparing for Surgery  Before surgery, you can play an important role.  Because skin is not sterile, your skin needs to be as free of germs as possible.  You can reduce the number of germs on you skin by washing with CHG (chlorahexidine gluconate) soap before surgery.  CHG is an antiseptic cleaner which kills germs and bonds with the skin to continue killing germs even after  washing.  Please DO NOT use if you have an allergy to CHG or antibacterial soaps.  If your skin becomes reddened/irritated stop using the CHG and inform your nurse when you arrive at Short Stay.  Do not shave (including legs and underarms) for at least 48 hours prior to the first CHG shower.  You may shave your face.  Please follow these instructions carefully:   1.  Shower with CHG Soap the night before surgery and the                                morning of Surgery.  2.  If you choose to wash your hair, wash your hair first as usual with your       normal shampoo.  3.  After you shampoo, rinse your hair and body thoroughly to remove the                      Shampoo.  4.  Use CHG as you would any other liquid soap.  You can apply chg directly       to the skin and wash gently with scrungie or a clean washcloth.  5.  Apply the CHG Soap to your body ONLY FROM THE NECK DOWN.        Do not use on open wounds or open sores.  Avoid contact with your eyes,  ears, mouth and genitals (private parts).  Wash genitals (private parts)       with your normal soap.  6.  Wash thoroughly, paying special attention to the area where your surgery        will be performed.  7.  Thoroughly rinse your body with warm water from the neck down.  8.  DO NOT shower/wash with your normal soap after using and rinsing off       the CHG Soap.  9.  Pat yourself dry with a clean towel.            10.  Wear clean pajamas.            11.  Place clean sheets on your bed the night of your first shower and do not        sleep with pets.  Day of Surgery  Do not apply any lotions/deoderants the morning of surgery.  Please wear clean clothes to the hospital/surgery center.     Please read over the following fact sheets that you were given. MRSA Information and Surgical Site Infection Prevention

## 2018-02-27 ENCOUNTER — Encounter (HOSPITAL_COMMUNITY): Payer: Self-pay

## 2018-02-27 ENCOUNTER — Encounter (HOSPITAL_COMMUNITY)
Admission: RE | Admit: 2018-02-27 | Discharge: 2018-02-27 | Disposition: A | Discharge status: Home / Self Care | Payer: Medicaid Other | Source: Ambulatory Visit | Attending: Neurological Surgery | Admitting: Neurological Surgery

## 2018-02-27 ENCOUNTER — Other Ambulatory Visit: Payer: Self-pay

## 2018-02-27 ENCOUNTER — Ambulatory Visit (HOSPITAL_COMMUNITY)
Admission: RE | Admit: 2018-02-27 | Discharge: 2018-02-27 | Disposition: A | Discharge status: Home / Self Care | Payer: Medicaid Other | Source: Ambulatory Visit | Attending: Neurological Surgery | Admitting: Neurological Surgery

## 2018-02-27 DIAGNOSIS — Z01810 Encounter for preprocedural cardiovascular examination: Secondary | ICD-10-CM | POA: Diagnosis not present

## 2018-02-27 DIAGNOSIS — M5126 Other intervertebral disc displacement, lumbar region: Secondary | ICD-10-CM

## 2018-02-27 HISTORY — DX: Dyspnea, unspecified: R06.00

## 2018-02-27 HISTORY — DX: Gastro-esophageal reflux disease without esophagitis: K21.9

## 2018-02-27 LAB — CBC WITH DIFFERENTIAL/PLATELET
Basophils Absolute: 0.1 10*3/uL (ref 0.0–0.1)
Basophils Relative: 1 %
EOS ABS: 0.3 10*3/uL (ref 0.0–0.7)
EOS PCT: 5 %
HCT: 43 % (ref 36.0–46.0)
HEMOGLOBIN: 13.9 g/dL (ref 12.0–15.0)
LYMPHS ABS: 2.4 10*3/uL (ref 0.7–4.0)
Lymphocytes Relative: 42 %
MCH: 30 pg (ref 26.0–34.0)
MCHC: 32.3 g/dL (ref 30.0–36.0)
MCV: 92.9 fL (ref 78.0–100.0)
MONOS PCT: 5 %
Monocytes Absolute: 0.3 10*3/uL (ref 0.1–1.0)
Neutro Abs: 2.8 10*3/uL (ref 1.7–7.7)
Neutrophils Relative %: 47 %
PLATELETS: 195 10*3/uL (ref 150–400)
RBC: 4.63 MIL/uL (ref 3.87–5.11)
RDW: 12.7 % (ref 11.5–15.5)
WBC: 5.8 10*3/uL (ref 4.0–10.5)

## 2018-02-27 LAB — BASIC METABOLIC PANEL
Anion gap: 5 (ref 5–15)
BUN: 6 mg/dL (ref 6–20)
CHLORIDE: 106 mmol/L (ref 101–111)
CO2: 29 mmol/L (ref 22–32)
CREATININE: 0.59 mg/dL (ref 0.44–1.00)
Calcium: 9 mg/dL (ref 8.9–10.3)
GFR calc Af Amer: >60 mL/min (ref >60)
GFR calc non Af Amer: >60 mL/min (ref >60)
Glucose, Bld: 93 mg/dL (ref 65–99)
Potassium: 3.7 mmol/L (ref 3.5–5.1)
Sodium: 140 mmol/L (ref 135–145)

## 2018-02-27 LAB — SURGICAL PCR SCREEN
MRSA, PCR: NEGATIVE
Staphylococcus aureus: NEGATIVE

## 2018-02-27 LAB — PROTIME-INR
INR: 0.94
PROTHROMBIN TIME: 12.5 s (ref 11.4–15.2)

## 2018-03-07 ENCOUNTER — Observation Stay (HOSPITAL_COMMUNITY)
Admission: RE | Admit: 2018-03-07 | Discharge: 2018-03-08 | Disposition: A | Discharge status: Home / Self Care | Payer: Medicaid Other | Source: Ambulatory Visit | Attending: Neurological Surgery | Admitting: Neurological Surgery

## 2018-03-07 ENCOUNTER — Encounter (HOSPITAL_COMMUNITY)
Admission: RE | Disposition: A | Discharge status: Home / Self Care | Payer: Self-pay | Source: Ambulatory Visit | Attending: Neurological Surgery

## 2018-03-07 ENCOUNTER — Ambulatory Visit (HOSPITAL_COMMUNITY): Payer: Medicaid Other | Admitting: Emergency Medicine

## 2018-03-07 ENCOUNTER — Observation Stay (HOSPITAL_COMMUNITY): Payer: Medicaid Other

## 2018-03-07 ENCOUNTER — Encounter (HOSPITAL_COMMUNITY): Payer: Self-pay | Admitting: *Deleted

## 2018-03-07 ENCOUNTER — Ambulatory Visit (HOSPITAL_COMMUNITY): Payer: Medicaid Other | Admitting: Anesthesiology

## 2018-03-07 DIAGNOSIS — Z419 Encounter for procedure for purposes other than remedying health state, unspecified: Secondary | ICD-10-CM

## 2018-03-07 DIAGNOSIS — M79605 Pain in left leg: Secondary | ICD-10-CM | POA: Diagnosis present

## 2018-03-07 DIAGNOSIS — F17210 Nicotine dependence, cigarettes, uncomplicated: Secondary | ICD-10-CM | POA: Insufficient documentation

## 2018-03-07 DIAGNOSIS — M5116 Intervertebral disc disorders with radiculopathy, lumbar region: Principal | ICD-10-CM | POA: Insufficient documentation

## 2018-03-07 DIAGNOSIS — G43909 Migraine, unspecified, not intractable, without status migrainosus: Secondary | ICD-10-CM | POA: Diagnosis not present

## 2018-03-07 DIAGNOSIS — F329 Major depressive disorder, single episode, unspecified: Secondary | ICD-10-CM | POA: Insufficient documentation

## 2018-03-07 DIAGNOSIS — Z9889 Other specified postprocedural states: Secondary | ICD-10-CM

## 2018-03-07 DIAGNOSIS — Z79899 Other long term (current) drug therapy: Secondary | ICD-10-CM | POA: Diagnosis not present

## 2018-03-07 DIAGNOSIS — K219 Gastro-esophageal reflux disease without esophagitis: Secondary | ICD-10-CM | POA: Diagnosis not present

## 2018-03-07 DIAGNOSIS — F419 Anxiety disorder, unspecified: Secondary | ICD-10-CM | POA: Insufficient documentation

## 2018-03-07 DIAGNOSIS — I10 Essential (primary) hypertension: Secondary | ICD-10-CM | POA: Diagnosis not present

## 2018-03-07 HISTORY — PX: LUMBAR LAMINECTOMY/DECOMPRESSION MICRODISCECTOMY: SHX5026

## 2018-03-07 SURGERY — LUMBAR LAMINECTOMY/DECOMPRESSION MICRODISCECTOMY 1 LEVEL
Anesthesia: General | Site: Spine Lumbar | Laterality: Left

## 2018-03-07 MED ORDER — ONDANSETRON HCL 4 MG/2ML IJ SOLN
INTRAMUSCULAR | Status: AC
  Filled 2018-03-07: qty 2

## 2018-03-07 MED ORDER — SODIUM CHLORIDE 0.9 % IV SOLN: 250.0000 mL | INTRAVENOUS | Status: DC

## 2018-03-07 MED ORDER — SODIUM CHLORIDE 0.9 % IV SOLN
INTRAVENOUS | Status: DC | PRN
  Administered 2018-03-07: 500 mL

## 2018-03-07 MED ORDER — CELECOXIB 100 MG PO CAPS
100.0000 mg | ORAL_CAPSULE | Freq: Every day | ORAL | Status: DC | PRN
  Filled 2018-03-07: qty 1

## 2018-03-07 MED ORDER — OXYCODONE HCL 5 MG PO TABS
ORAL_TABLET | ORAL | Status: AC
  Filled 2018-03-07: qty 1

## 2018-03-07 MED ORDER — PHENOL 1.4 % MT LIQD: 1.0000 | OROMUCOSAL | Status: DC | PRN

## 2018-03-07 MED ORDER — CEFAZOLIN SODIUM-DEXTROSE 2-4 GM/100ML-% IV SOLN
2.0000 g | INTRAVENOUS | Status: AC
  Administered 2018-03-07: 2 g via INTRAVENOUS

## 2018-03-07 MED ORDER — NICOTINE 21 MG/24HR TD PT24
21.0000 mg | MEDICATED_PATCH | Freq: Every day | TRANSDERMAL | Status: DC
  Administered 2018-03-07 – 2018-03-08 (×2): 21 mg via TRANSDERMAL
  Filled 2018-03-07 (×2): qty 1

## 2018-03-07 MED ORDER — OXYCODONE HCL 5 MG/5ML PO SOLN: 5.0000 mg | Freq: Once | ORAL | Status: AC | PRN

## 2018-03-07 MED ORDER — BUPIVACAINE HCL (PF) 0.25 % IJ SOLN
INTRAMUSCULAR | Status: DC | PRN
  Administered 2018-03-07: 2 mL

## 2018-03-07 MED ORDER — DEXAMETHASONE SODIUM PHOSPHATE 10 MG/ML IJ SOLN
10.0000 mg | INTRAMUSCULAR | Status: AC
  Administered 2018-03-07: 10 mg via INTRAVENOUS

## 2018-03-07 MED ORDER — PROMETHAZINE HCL 25 MG/ML IJ SOLN: 6.2500 mg | INTRAMUSCULAR | Status: DC | PRN

## 2018-03-07 MED ORDER — DULOXETINE HCL 20 MG PO CPEP: 20.0000 mg | ORAL_CAPSULE | Freq: Every day | ORAL | Status: DC

## 2018-03-07 MED ORDER — SUMATRIPTAN SUCCINATE 100 MG PO TABS
100.0000 mg | ORAL_TABLET | Freq: Every day | ORAL | Status: DC | PRN
  Administered 2018-03-07 – 2018-03-08 (×2): 100 mg via ORAL
  Filled 2018-03-07 (×3): qty 1

## 2018-03-07 MED ORDER — EPHEDRINE SULFATE 50 MG/ML IJ SOLN
INTRAMUSCULAR | Status: AC
  Filled 2018-03-07: qty 1

## 2018-03-07 MED ORDER — ONDANSETRON HCL 4 MG PO TABS: 4.0000 mg | ORAL_TABLET | Freq: Four times a day (QID) | ORAL | Status: DC | PRN

## 2018-03-07 MED ORDER — SODIUM CHLORIDE 0.9% FLUSH
3.0000 mL | Freq: Two times a day (BID) | INTRAVENOUS | Status: DC
  Administered 2018-03-07: 3 mL via INTRAVENOUS

## 2018-03-07 MED ORDER — METHOCARBAMOL 500 MG PO TABS
500.0000 mg | ORAL_TABLET | Freq: Four times a day (QID) | ORAL | Status: DC | PRN
  Administered 2018-03-07 – 2018-03-08 (×3): 500 mg via ORAL
  Filled 2018-03-07 (×2): qty 1

## 2018-03-07 MED ORDER — BUPIVACAINE HCL (PF) 0.25 % IJ SOLN
INTRAMUSCULAR | Status: AC
  Filled 2018-03-07: qty 30

## 2018-03-07 MED ORDER — HYDROCODONE-ACETAMINOPHEN 10-325 MG PO TABS
1.0000 | ORAL_TABLET | Freq: Four times a day (QID) | ORAL | Status: DC | PRN
  Administered 2018-03-07 (×2): 1 via ORAL
  Filled 2018-03-07 (×2): qty 1

## 2018-03-07 MED ORDER — FENTANYL CITRATE (PF) 250 MCG/5ML IJ SOLN
INTRAMUSCULAR | Status: AC
  Filled 2018-03-07: qty 5

## 2018-03-07 MED ORDER — MIDAZOLAM HCL 2 MG/2ML IJ SOLN
INTRAMUSCULAR | Status: AC
  Filled 2018-03-07: qty 2

## 2018-03-07 MED ORDER — ONDANSETRON HCL 4 MG/2ML IJ SOLN: 4.0000 mg | Freq: Four times a day (QID) | INTRAMUSCULAR | Status: DC | PRN

## 2018-03-07 MED ORDER — SUGAMMADEX SODIUM 200 MG/2ML IV SOLN
INTRAVENOUS | Status: DC | PRN
  Administered 2018-03-07: 150 mg via INTRAVENOUS

## 2018-03-07 MED ORDER — ONDANSETRON HCL 4 MG/2ML IJ SOLN
INTRAMUSCULAR | Status: DC | PRN
  Administered 2018-03-07: 4 mg via INTRAVENOUS

## 2018-03-07 MED ORDER — SODIUM CHLORIDE 0.9 % IJ SOLN
INTRAMUSCULAR | Status: AC
  Filled 2018-03-07: qty 10

## 2018-03-07 MED ORDER — POTASSIUM CHLORIDE IN NACL 20-0.9 MEQ/L-% IV SOLN: INTRAVENOUS | Status: DC

## 2018-03-07 MED ORDER — THROMBIN 5000 UNITS EX SOLR
CUTANEOUS | Status: DC | PRN
  Administered 2018-03-07 (×2): 5000 [IU] via TOPICAL

## 2018-03-07 MED ORDER — METHOCARBAMOL 1000 MG/10ML IJ SOLN
500.0000 mg | Freq: Four times a day (QID) | INTRAVENOUS | Status: DC | PRN
  Filled 2018-03-07: qty 5

## 2018-03-07 MED ORDER — SENNA 8.6 MG PO TABS
1.0000 | ORAL_TABLET | Freq: Two times a day (BID) | ORAL | Status: DC
  Administered 2018-03-07 – 2018-03-08 (×2): 8.6 mg via ORAL
  Filled 2018-03-07 (×2): qty 1

## 2018-03-07 MED ORDER — HYDROMORPHONE HCL 2 MG/ML IJ SOLN
0.2500 mg | INTRAMUSCULAR | Status: DC | PRN
  Administered 2018-03-07 (×4): 0.5 mg via INTRAVENOUS

## 2018-03-07 MED ORDER — ACETAMINOPHEN 650 MG RE SUPP: 650.0000 mg | RECTAL | Status: DC | PRN

## 2018-03-07 MED ORDER — ROCURONIUM BROMIDE 50 MG/5ML IV SOLN
INTRAVENOUS | Status: AC
  Filled 2018-03-07: qty 1

## 2018-03-07 MED ORDER — HEMOSTATIC AGENTS (NO CHARGE) OPTIME
TOPICAL | Status: DC | PRN
  Administered 2018-03-07: 1 via TOPICAL

## 2018-03-07 MED ORDER — LACTATED RINGERS IV SOLN
INTRAVENOUS | Status: DC | PRN
  Administered 2018-03-07 (×2): via INTRAVENOUS

## 2018-03-07 MED ORDER — BACLOFEN 10 MG PO TABS
10.0000 mg | ORAL_TABLET | Freq: Four times a day (QID) | ORAL | Status: DC | PRN
  Filled 2018-03-07: qty 1

## 2018-03-07 MED ORDER — ROCURONIUM BROMIDE 100 MG/10ML IV SOLN
INTRAVENOUS | Status: DC | PRN
  Administered 2018-03-07: 50 mg via INTRAVENOUS

## 2018-03-07 MED ORDER — DEXAMETHASONE SODIUM PHOSPHATE 10 MG/ML IJ SOLN
INTRAMUSCULAR | Status: AC
  Filled 2018-03-07: qty 1

## 2018-03-07 MED ORDER — PROPOFOL 10 MG/ML IV BOLUS
INTRAVENOUS | Status: DC | PRN
  Administered 2018-03-07: 160 mg via INTRAVENOUS

## 2018-03-07 MED ORDER — OXYCODONE HCL 5 MG PO TABS
5.0000 mg | ORAL_TABLET | Freq: Once | ORAL | Status: AC | PRN
  Administered 2018-03-07: 5 mg via ORAL

## 2018-03-07 MED ORDER — THROMBIN 5000 UNITS EX SOLR
CUTANEOUS | Status: AC
  Filled 2018-03-07: qty 15000

## 2018-03-07 MED ORDER — FENTANYL CITRATE (PF) 100 MCG/2ML IJ SOLN
INTRAMUSCULAR | Status: DC | PRN
  Administered 2018-03-07: 50 ug via INTRAVENOUS
  Administered 2018-03-07: 100 ug via INTRAVENOUS
  Administered 2018-03-07 (×2): 50 ug via INTRAVENOUS

## 2018-03-07 MED ORDER — CEFAZOLIN SODIUM-DEXTROSE 2-4 GM/100ML-% IV SOLN
2.0000 g | Freq: Three times a day (TID) | INTRAVENOUS | Status: AC
  Administered 2018-03-07 (×2): 2 g via INTRAVENOUS
  Filled 2018-03-07 (×2): qty 100

## 2018-03-07 MED ORDER — CHLORHEXIDINE GLUCONATE CLOTH 2 % EX PADS: 6.0000 | MEDICATED_PAD | Freq: Once | CUTANEOUS | Status: DC

## 2018-03-07 MED ORDER — MIDAZOLAM HCL 5 MG/5ML IJ SOLN
INTRAMUSCULAR | Status: DC | PRN
  Administered 2018-03-07: 2 mg via INTRAVENOUS

## 2018-03-07 MED ORDER — LIDOCAINE HCL (CARDIAC) PF 100 MG/5ML IV SOSY
PREFILLED_SYRINGE | INTRAVENOUS | Status: DC | PRN
  Administered 2018-03-07: 80 mg via INTRAVENOUS

## 2018-03-07 MED ORDER — PANTOPRAZOLE SODIUM 40 MG PO TBEC
40.0000 mg | DELAYED_RELEASE_TABLET | Freq: Every day | ORAL | Status: DC | PRN
  Administered 2018-03-08: 40 mg via ORAL
  Filled 2018-03-07: qty 1

## 2018-03-07 MED ORDER — ROPINIROLE HCL 1 MG PO TABS: 0.5000 mg | ORAL_TABLET | Freq: Every day | ORAL | Status: DC

## 2018-03-07 MED ORDER — 0.9 % SODIUM CHLORIDE (POUR BTL) OPTIME
TOPICAL | Status: DC | PRN
  Administered 2018-03-07: 1000 mL

## 2018-03-07 MED ORDER — CLONAZEPAM 1 MG PO TABS
1.0000 mg | ORAL_TABLET | Freq: Two times a day (BID) | ORAL | Status: DC | PRN
  Administered 2018-03-07 – 2018-03-08 (×2): 1 mg via ORAL
  Filled 2018-03-07 (×2): qty 1

## 2018-03-07 MED ORDER — SODIUM CHLORIDE 0.9% FLUSH: 3.0000 mL | INTRAVENOUS | Status: DC | PRN

## 2018-03-07 MED ORDER — METHOCARBAMOL 500 MG PO TABS
ORAL_TABLET | ORAL | Status: AC
  Filled 2018-03-07: qty 1

## 2018-03-07 MED ORDER — CEFAZOLIN SODIUM-DEXTROSE 2-4 GM/100ML-% IV SOLN
INTRAVENOUS | Status: AC
  Filled 2018-03-07: qty 100

## 2018-03-07 MED ORDER — MEPERIDINE HCL 50 MG/ML IJ SOLN: 6.2500 mg | INTRAMUSCULAR | Status: DC | PRN

## 2018-03-07 MED ORDER — ACETAMINOPHEN 325 MG PO TABS: 650.0000 mg | ORAL_TABLET | ORAL | Status: DC | PRN

## 2018-03-07 MED ORDER — HYDROMORPHONE HCL 1 MG/ML IJ SOLN
0.5000 mg | INTRAMUSCULAR | Status: DC | PRN
  Administered 2018-03-07 (×2): 0.5 mg via INTRAVENOUS
  Filled 2018-03-07 (×2): qty 0.5

## 2018-03-07 MED ORDER — HYDROMORPHONE HCL 2 MG/ML IJ SOLN
INTRAMUSCULAR | Status: AC
  Administered 2018-03-07: 0.5 mg via INTRAVENOUS
  Filled 2018-03-07: qty 1

## 2018-03-07 MED ORDER — SUGAMMADEX SODIUM 200 MG/2ML IV SOLN
INTRAVENOUS | Status: AC
  Filled 2018-03-07: qty 2

## 2018-03-07 MED ORDER — MENTHOL 3 MG MT LOZG: 1.0000 | LOZENGE | OROMUCOSAL | Status: DC | PRN

## 2018-03-07 MED ORDER — LIDOCAINE 2% (20 MG/ML) 5 ML SYRINGE
INTRAMUSCULAR | Status: AC
  Filled 2018-03-07: qty 5

## 2018-03-07 MED ORDER — ACETAMINOPHEN 10 MG/ML IV SOLN
1000.0000 mg | INTRAVENOUS | Status: AC
  Administered 2018-03-07: 1000 mg via INTRAVENOUS
  Filled 2018-03-07: qty 100

## 2018-03-07 MED ORDER — PROPOFOL 10 MG/ML IV BOLUS
INTRAVENOUS | Status: AC
  Filled 2018-03-07: qty 20

## 2018-03-07 MED ORDER — PREGABALIN 75 MG PO CAPS
200.0000 mg | ORAL_CAPSULE | Freq: Every day | ORAL | Status: DC
  Administered 2018-03-07: 200 mg via ORAL
  Filled 2018-03-07: qty 1

## 2018-03-07 MED ORDER — HYDROCODONE-ACETAMINOPHEN 10-325 MG PO TABS
1.0000 | ORAL_TABLET | ORAL | Status: DC | PRN
  Administered 2018-03-07 – 2018-03-08 (×3): 1 via ORAL
  Filled 2018-03-07 (×3): qty 1

## 2018-03-07 SURGICAL SUPPLY — 55 items
ADH SKN CLS APL DERMABOND .7 (GAUZE/BANDAGES/DRESSINGS) ×1
APL SKNCLS STERI-STRIP NONHPOA (GAUZE/BANDAGES/DRESSINGS) ×1
BAG DECANTER FOR FLEXI CONT (MISCELLANEOUS) ×3 IMPLANT
BENZOIN TINCTURE PRP APPL 2/3 (GAUZE/BANDAGES/DRESSINGS) ×3 IMPLANT
BUR MATCHSTICK NEURO 3.0 LAGG (BURR) ×3 IMPLANT
CANISTER SUCT 3000ML PPV (MISCELLANEOUS) ×3 IMPLANT
CARTRIDGE OIL MAESTRO DRILL (MISCELLANEOUS) ×1 IMPLANT
CLOSURE WOUND 1/2 X4 (GAUZE/BANDAGES/DRESSINGS) ×1
DERMABOND ADVANCED (GAUZE/BANDAGES/DRESSINGS) ×2
DERMABOND ADVANCED .7 DNX12 (GAUZE/BANDAGES/DRESSINGS) IMPLANT
DIFFUSER DRILL AIR PNEUMATIC (MISCELLANEOUS) ×3 IMPLANT
DRAPE LAPAROTOMY 100X72X124 (DRAPES) ×3 IMPLANT
DRAPE MICROSCOPE LEICA (MISCELLANEOUS) ×3 IMPLANT
DRAPE POUCH INSTRU U-SHP 10X18 (DRAPES) ×1 IMPLANT
DRAPE SURG 17X23 STRL (DRAPES) ×3 IMPLANT
DRSG OPSITE POSTOP 3X4 (GAUZE/BANDAGES/DRESSINGS) ×2 IMPLANT
DURAPREP 26ML APPLICATOR (WOUND CARE) ×3 IMPLANT
ELECT REM PT RETURN 9FT ADLT (ELECTROSURGICAL) ×3
ELECTRODE REM PT RTRN 9FT ADLT (ELECTROSURGICAL) ×1 IMPLANT
GAUZE SPONGE 4X4 16PLY XRAY LF (GAUZE/BANDAGES/DRESSINGS) IMPLANT
GLOVE BIO SURGEON STRL SZ7 (GLOVE) ×2 IMPLANT
GLOVE BIO SURGEON STRL SZ8 (GLOVE) ×5 IMPLANT
GLOVE BIOGEL PI IND STRL 6.5 (GLOVE) IMPLANT
GLOVE BIOGEL PI IND STRL 7.0 (GLOVE) IMPLANT
GLOVE BIOGEL PI IND STRL 8.5 (GLOVE) IMPLANT
GLOVE BIOGEL PI INDICATOR 6.5 (GLOVE) ×4
GLOVE BIOGEL PI INDICATOR 7.0 (GLOVE) ×4
GLOVE BIOGEL PI INDICATOR 8.5 (GLOVE) ×2
GLOVE SURG SS PI 6.5 STRL IVOR (GLOVE) ×4 IMPLANT
GOWN STRL REUS W/ TWL LRG LVL3 (GOWN DISPOSABLE) IMPLANT
GOWN STRL REUS W/ TWL XL LVL3 (GOWN DISPOSABLE) ×1 IMPLANT
GOWN STRL REUS W/TWL 2XL LVL3 (GOWN DISPOSABLE) IMPLANT
GOWN STRL REUS W/TWL LRG LVL3 (GOWN DISPOSABLE) ×9
GOWN STRL REUS W/TWL XL LVL3 (GOWN DISPOSABLE) ×6
HEMOSTAT POWDER KIT SURGIFOAM (HEMOSTASIS) ×2 IMPLANT
KIT BASIN OR (CUSTOM PROCEDURE TRAY) ×3 IMPLANT
KIT TURNOVER KIT B (KITS) ×3 IMPLANT
NDL HYPO 25X1 1.5 SAFETY (NEEDLE) ×1 IMPLANT
NDL SPNL 20GX3.5 QUINCKE YW (NEEDLE) IMPLANT
NEEDLE HYPO 25X1 1.5 SAFETY (NEEDLE) ×3 IMPLANT
NEEDLE SPNL 20GX3.5 QUINCKE YW (NEEDLE) ×3 IMPLANT
NS IRRIG 1000ML POUR BTL (IV SOLUTION) ×3 IMPLANT
OIL CARTRIDGE MAESTRO DRILL (MISCELLANEOUS) ×3
PACK LAMINECTOMY NEURO (CUSTOM PROCEDURE TRAY) ×3 IMPLANT
PAD ARMBOARD 7.5X6 YLW CONV (MISCELLANEOUS) ×13 IMPLANT
RUBBERBAND STERILE (MISCELLANEOUS) ×6 IMPLANT
SPONGE SURGIFOAM ABS GEL SZ50 (HEMOSTASIS) ×2 IMPLANT
STRIP CLOSURE SKIN 1/2X4 (GAUZE/BANDAGES/DRESSINGS) ×2 IMPLANT
SUT VIC AB 0 CT1 18XCR BRD8 (SUTURE) ×1 IMPLANT
SUT VIC AB 0 CT1 8-18 (SUTURE) ×3
SUT VIC AB 2-0 CP2 18 (SUTURE) ×3 IMPLANT
SUT VIC AB 3-0 SH 8-18 (SUTURE) ×5 IMPLANT
TOWEL GREEN STERILE (TOWEL DISPOSABLE) ×3 IMPLANT
TOWEL GREEN STERILE FF (TOWEL DISPOSABLE) ×3 IMPLANT
WATER STERILE IRR 1000ML POUR (IV SOLUTION) ×3 IMPLANT

## 2018-03-07 NOTE — H&P (Signed)
Subjective: Patient is a 42 y.o. female admitted for L leg pain. Onset of symptoms was several months ago, rapidly worsening since that time.  The pain is rated intense, and is located at the across the lower back and radiates to LLE. The pain is described as aching and occurs all day. The symptoms have been progressive. Symptoms are exacerbated by exercise. MRI or CT showed HNP L4-5 L   Past Medical History:  Diagnosis Date  . Abnormal Pap smear   . ADHD (attention deficit hyperactivity disorder)   . Anemia   . Anxiety 03/27/2013  . Breast lump 03/27/2013  . Degenerative cervical disc   . Depression   . Diverticulosis   . Dyspnea    WITH PAIN   . Endometriosis   . Fibromyalgia 03/27/2013  . GERD (gastroesophageal reflux disease)   . HPV (human papilloma virus) infection   . Hx of degenerative disc disease   . Migraine   . PIH (pregnancy induced hypertension)    in pregnancy only  . Sciatic nerve pain, right     Past Surgical History:  Procedure Laterality Date  . ABDOMINAL HYSTERECTOMY     partial  . BIOPSY  09/26/2017   Procedure: BIOPSY;  Surgeon: Daneil Dolin, MD;  Location: [facility];  Service: Endoscopy;;  gastric  . COLONOSCOPY WITH PROPOFOL N/A 09/26/2017   Procedure: COLONOSCOPY WITH PROPOFOL;  Surgeon: Daneil Dolin, MD;  Location: [facility];  Service: Endoscopy;  Laterality: N/A;  9:30am  . DILATION AND CURETTAGE OF UTERUS    . dilation and curretage    . ESOPHAGOGASTRODUODENOSCOPY (EGD) WITH PROPOFOL N/A 09/26/2017   Procedure: ESOPHAGOGASTRODUODENOSCOPY (EGD) WITH PROPOFOL;  Surgeon: Daneil Dolin, MD;  Location: [facility];  Service: Endoscopy;  Laterality: N/A;  . HAND SURGERY Right    tendon repair  . TUBAL LIGATION  07/24/2011   Procedure: POST PARTUM TUBAL LIGATION;  Surgeon: Mora Bellman, MD;  Location: [facility];  Service: Gynecology;  Laterality: Bilateral;  . VAGINAL HYSTERECTOMY N/A 09/02/2014   Procedure: HYSTERECTOMY VAGINAL;  Surgeon: Florian Buff, MD;  Location: [facility];  Service: Gynecology;  Laterality: N/A;    Prior to Admission medications   Medication Sig Start Date End Date Taking? Authorizing Provider  aspirin-acetaminophen-caffeine (EXCEDRIN MIGRAINE) [phone] MG per tablet Take 2 tablets by mouth daily as needed for headache.    Yes [provider]  baclofen (LIORESAL) 10 MG tablet Take 10 mg by mouth every 6 (six) hours as needed for muscle spasms.    Yes [provider]  Biotin 1000 MCG tablet Take 1,000 mcg by mouth daily.   Yes [provider]  butalbital-acetaminophen-caffeine (FIORICET, ESGIC) 50-325-40 MG tablet Take 1 tablet by mouth daily as needed for headache or migraine.   Yes [provider]  celecoxib (CELEBREX) 100 MG capsule Take 100 mg by mouth daily as needed for moderate pain.    Yes [provider]  clonazePAM (KLONOPIN) 1 MG tablet Take 1 mg by mouth 2 (two) times daily as needed for anxiety.    Yes [provider]  Cyanocobalamin (B-12) 2500 MCG TABS Take 2,500 mcg by mouth daily.   Yes [provider]  dicyclomine (BENTYL) 10 MG capsule Take 1 capsule (10 mg total) by mouth 4 (four) times daily -  before meals and at bedtime. Patient taking differently: Take 10 mg by mouth 4 (four) times daily as needed for spasms.  12/21/17  Yes Rourk, Cristopher Estimable,  MD  DULoxetine (CYMBALTA) 20 MG capsule Take 20 mg by mouth daily.   Yes [provider]  ferrous sulfate 325 (65 FE) MG EC tablet Take 325 mg by mouth daily.   Yes [provider]  HYDROcodone-acetaminophen (NORCO) 10-325 MG per tablet Take 1 tablet by mouth every 6 (six) hours as needed for moderate pain.    Yes [provider]  hyoscyamine (LEVSIN SL) 0.125 MG SL tablet Place 0.125 mg under the tongue 4 (four) times daily as needed for cramping.   Yes [provider]  methocarbamol (ROBAXIN) 500 MG tablet Take 500 mg by mouth daily as needed for muscle spasms.    Yes [provider]  OVER THE COUNTER MEDICATION Take 250 mg by mouth daily. Keratin otc supplement   Yes [provider]  pantoprazole (PROTONIX) 40 MG tablet Take 40 mg by mouth daily as needed (acid reflux).    Yes [provider]  pregabalin (LYRICA) 200 MG capsule Take 200 mg by mouth at bedtime.   Yes [provider]  RABEprazole (ACIPHEX) 20 MG tablet Take 1 tablet (20 mg total) by mouth daily. 12/21/17  Yes Rourk, Cristopher Estimable, MD  ranitidine (ZANTAC) 150 MG tablet Take 150 mg by mouth daily as needed for heartburn.   Yes [provider]  rizatriptan (MAXALT) 10 MG tablet Take 10 mg by mouth as needed for migraine. May repeat in 2 hours if needed   Yes [provider]  rOPINIRole (REQUIP) 0.5 MG tablet Take 0.5 mg by mouth at bedtime.   Yes [provider]  vitamin C (ASCORBIC ACID) 500 MG tablet Take 500 mg by mouth daily.   Yes [provider]  Vitamin D, Ergocalciferol, (DRISDOL) 50000 units CAPS capsule Take 50,000 Units by mouth every Monday.    Yes [provider]  fluconazole (DIFLUCAN) 150 MG tablet 1 po stat; repeat in 3 days Patient not taking: Reported on 02/21/2018 12/27/17   Cresenzo-Dishmon, Joaquim Lai, CNM  Na Sulfate-K Sulfate-Mg Sulf (SUPREP BOWEL PREP KIT) 17.5-3.13-1.6 GM/177ML SOLN Take 1 kit by mouth as directed. Patient not taking: Reported on 12/21/2017 08/17/17   Rourk, Cristopher Estimable, MD   No Known Allergies  Social History   Tobacco Use  . Smoking status: Current Every Day Smoker    Packs/day: 1.00    Years: 20.00    Pack years: 20.00    Types: Cigarettes  . Smokeless tobacco: Never Used  Substance Use Topics  . Alcohol use: No    Frequency: Never    Family History  Problem Relation Age of Onset  . Cancer Father        kidney   . Hypertension Father   . Heart disease Father   . Hyperlipidemia Father   . Other Brother        Factor 13 def.  . Clotting disorder Brother   . Hyperlipidemia  Paternal Grandfather   . Heart disease Paternal Grandfather   . Ulcers Brother   . Hypertension Other   . Inflammatory bowel disease Neg Hx   . Celiac disease Neg Hx   . Colon cancer Neg Hx      Review of Systems  Positive ROS: neg  All other systems have been reviewed and were otherwise negative with the exception of those mentioned in the HPI and as above.  Objective: Vital signs in last 24 hours: Temp:  [98.2 F (36.8 C)] 98.2 F (36.8 C) (05/16 0541) Pulse Rate:  [71] 71 (05/16  0541) Resp:  [20] 20 (05/16 0541) BP: (112)/(74) 112/74 (05/16 0541) SpO2:  [99 %] 99 % (05/16 0541) Weight:  [65.8 kg (145 lb)] 65.8 kg (145 lb) (05/16 0554)  General Appearance: Alert, cooperative, no distress, appears stated age Head: Normocephalic, without obvious abnormality, atraumatic Eyes: PERRL, conjunctiva/corneas clear, EOM's intact    Neck: Supple, symmetrical, trachea midline Back: Symmetric, no curvature, ROM normal, no CVA tenderness Lungs:  respirations unlabored Heart: Regular rate and rhythm Abdomen: Soft, non-tender Extremities: Extremities normal, atraumatic, no cyanosis or edema Pulses: 2+ and symmetric all extremities Skin: Skin color, texture, turgor normal, no rashes or lesions  NEUROLOGIC:   Mental status: Alert and oriented x4,  no aphasia, good attention span, fund of knowledge, and memory Motor Exam - grossly normal Sensory Exam - grossly normal Reflexes: 1+ Coordination - grossly normal Gait - grossly normal Balance - grossly normal Cranial Nerves: I: smell Not tested  II: visual acuity  OS: nl    OD: nl  II: visual fields Full to confrontation  II: pupils Equal, round, reactive to light  III,VII: ptosis None  III,IV,VI: extraocular muscles  Full ROM  V: mastication Normal  V: facial light touch sensation  Normal  V,VII: corneal reflex  Present  VII: facial muscle function - upper  Normal  VII: facial muscle function - lower Normal  VIII: hearing Not  tested  IX: soft palate elevation  Normal  IX,X: gag reflex Present  XI: trapezius strength  5/5  XI: sternocleidomastoid strength 5/5  XI: neck flexion strength  5/5  XII: tongue strength  Normal    Data Review Lab Results  Component Value Date   WBC 5.8 02/27/2018   HGB 13.9 02/27/2018   HCT 43.0 02/27/2018   MCV 92.9 02/27/2018   PLT 195 02/27/2018   Lab Results  Component Value Date   NA 140 02/27/2018   K 3.7 02/27/2018   CL 106 02/27/2018   CO2 29 02/27/2018   BUN 6 02/27/2018   CREATININE 0.59 02/27/2018   GLUCOSE 93 02/27/2018   Lab Results  Component Value Date   INR 0.94 02/27/2018    Assessment/Plan:  Estimated body mass index is 22.05 kg/m as calculated from the following:   Height as of this encounter: 5' 8"  (1.727 m).   Weight as of this encounter: 65.8 kg (145 lb). Patient admitted for L L4-5 microdiskectomy. Patient has failed a reasonable attempt at conservative therapy.  I explained the condition and procedure to the patient and answered any questions.  Patient wishes to proceed with procedure as planned. Understands risks/ benefits and typical outcomes of procedure.   Haneefah Venturini S 03/07/2018 7:26 AM

## 2018-03-07 NOTE — Op Note (Signed)
03/07/2018  9:10 AM  PATIENT:  Kathryn Golden  42 y.o. female  PRE-OPERATIVE DIAGNOSIS:  Left L4-5 herniated nucleus pulposis with left L5 radiculopathy  POST-OPERATIVE DIAGNOSIS:  same  PROCEDURE:  Left L4-5 hemi-laminectomy medial facetectomy and foraminotomy followed by microdiscectomy L4-5 on the left utilizing microscopic dissection  SURGEON:  Marikay Alar, MD  ASSISTANTS: Dr. Venetia Maxon  ANESTHESIA:   General  EBL: 25 ml  Total I/O In: 1000 [I.V.:1000] Out: 25 [Blood:25]  BLOOD ADMINISTERED: none  DRAINS: None  SPECIMEN:  none  INDICATION FOR PROCEDURE: This patient presented with severe left leg pain. Imaging showed herniated disc L4-5 left. The patient tried conservative measures without relief. Pain was debilitating. Recommended L4-5 microscopic discectomy. Patient understood the risks, benefits, and alternatives and potential outcomes and wished to proceed.  PROCEDURE DETAILS: The patient was taken to the operating room and after induction of adequate generalized endotracheal anesthesia, the patient was rolled into the prone position on the Wilson frame and all pressure points were padded. The lumbar region was cleaned and then prepped with DuraPrep and draped in the usual sterile fashion. 5 cc of local anesthesia was injected and then a dorsal midline incision was made and carried down to the lumbo sacral fascia. The fascia was opened and the paraspinous musculature was taken down in a subperiosteal fashion to expose L4-5 on the left. Intraoperative x-ray confirmed my level, and then I used a combination of the high-speed drill and the Kerrison punches to perform a hemilaminectomy, medial facetectomy, and foraminotomy at L4-5 on the left. The underlying yellow ligament was opened and removed in a piecemeal fashion to expose the underlying dura and exiting nerve root. I undercut the lateral recess and dissected down until I was medial to and distal to the pedicle. The nerve root was  well decompressed. We then gently retracted the nerve root medially with a retractor, coagulated the epidural venous vasculature, and incised the disc space. I performed a thorough discectomy with pituitary rongeurs and a nerve hook to remove several subannular pieces but never went into the disc space, until I had a nice decompression of the nerve root and the midline. I then palpated with a coronary dilator along the nerve root and into the foramen to assure adequate decompression. I felt no more compression of the nerve root. I irrigated with saline solution containing bacitracin. Achieved hemostasis with bipolar cautery, lined the dura with Gelfoam, and then closed the fascia with 0 Vicryl. I closed the subcutaneous tissues with 2-0 Vicryl and the subcuticular tissues with 3-0 Vicryl. The skin was then closed with benzoin and Steri-Strips. The drapes were removed, a sterile dressing was applied. The patient was awakened from general anesthesia and transferred to the recovery room in stable condition. At the end of the procedure all sponge, needle and instrument counts were correct.    PLAN OF CARE: Admit for overnight observation  PATIENT DISPOSITION:  PACU - hemodynamically stable.   Delay start of Pharmacological VTE agent (>24hrs) due to surgical blood loss or risk of bleeding:  yes

## 2018-03-07 NOTE — Anesthesia Preprocedure Evaluation (Addendum)
Anesthesia Evaluation  Patient identified by MRN, date of birth, ID band Patient awake    Reviewed: Allergy & Precautions, H&P , NPO status , Patient's Chart, lab work & pertinent test results  History of Anesthesia Complications (+) PONV  Airway Mallampati: II  TM Distance: >3 FB Neck ROM: Full    Dental no notable dental hx. (+) Teeth Intact, Dental Advisory Given   Pulmonary neg pulmonary ROS, Current Smoker,    Pulmonary exam normal breath sounds clear to auscultation       Cardiovascular hypertension, negative cardio ROS Normal cardiovascular exam Rhythm:Regular Rate:Normal     Neuro/Psych  Headaches, PSYCHIATRIC DISORDERS (ADHD) Anxiety Depression  Neuromuscular disease negative neurological ROS  negative psych ROS   GI/Hepatic negative GI ROS, Neg liver ROS, GERD  Medicated and Controlled,  Endo/Other  negative endocrine ROS  Renal/GU negative Renal ROS  negative genitourinary   Musculoskeletal  (+) Fibromyalgia -, narcotic dependent  Abdominal   Peds  Hematology negative hematology ROS (+)   Anesthesia Other Findings   Reproductive/Obstetrics negative OB ROS                            Anesthesia Physical  Anesthesia Plan  ASA: II  Anesthesia Plan: General   Post-op Pain Management:    Induction: Intravenous  PONV Risk Score and Plan: 3 and Ondansetron, Dexamethasone and Midazolam  Airway Management Planned: Oral ETT  Additional Equipment:   Intra-op Plan:   Post-operative Plan: Extubation in OR  Informed Consent: I have reviewed the patients History and Physical, chart, labs and discussed the procedure including the risks, benefits and alternatives for the proposed anesthesia with the patient or authorized representative who has indicated his/her understanding and acceptance.     Plan Discussed with:   Anesthesia Plan Comments:         Anesthesia Quick  Evaluation

## 2018-03-07 NOTE — Anesthesia Procedure Notes (Signed)
Procedure Name: Intubation Date/Time: 03/07/2018 7:44 AM Performed by: Lovie Chol, CRNA Pre-anesthesia Checklist: Patient identified, Emergency Drugs available, Suction available and Patient being monitored Patient Re-evaluated:Patient Re-evaluated prior to induction Oxygen Delivery Method: Circle System Utilized Preoxygenation: Pre-oxygenation with 100% oxygen Induction Type: IV induction Ventilation: Mask ventilation without difficulty Laryngoscope Size: Miller and 2 Grade View: Grade I Tube type: Oral Tube size: 7.0 mm Number of attempts: 1 Airway Equipment and Method: Stylet and Oral airway Placement Confirmation: ETT inserted through vocal cords under direct vision,  positive ETCO2 and breath sounds checked- equal and bilateral Secured at: 21 cm Tube secured with: Tape Dental Injury: Teeth and Oropharynx as per pre-operative assessment

## 2018-03-07 NOTE — Anesthesia Postprocedure Evaluation (Signed)
Anesthesia Post Note  Patient: Kathryn Golden  Procedure(s) Performed: Microdiscectomy - Lumbar Four-Five left (Left Spine Lumbar)     Patient location during evaluation: PACU Anesthesia Type: General Level of consciousness: awake and alert Pain management: pain level controlled Vital Signs Assessment: post-procedure vital signs reviewed and stable Respiratory status: spontaneous breathing, nonlabored ventilation and respiratory function stable Cardiovascular status: blood pressure returned to baseline and stable Postop Assessment: no apparent nausea or vomiting Anesthetic complications: no    Last Vitals:  Vitals:   03/07/18 1002 03/07/18 1026  BP:  115/67  Pulse: (!) 56 (!) 57  Resp: 12 18  Temp:  (!) 36.4 C  SpO2: 100% 100%    Last Pain:  Vitals:   03/07/18 1026  TempSrc: Oral  PainSc:                  [name]

## 2018-03-07 NOTE — Transfer of Care (Signed)
Immediate Anesthesia Transfer of Care Note  Patient: Kathryn Golden  Procedure(s) Performed: Microdiscectomy - Lumbar Four-Five left (Left Spine Lumbar)  Patient Location: PACU  Anesthesia Type:General  Level of Consciousness: awake, oriented and patient cooperative  Airway & Oxygen Therapy: Patient Spontanous Breathing and Patient connected to nasal cannula oxygen  Post-op Assessment: Report given to RN and Post -op Vital signs reviewed and stable  Post vital signs: Reviewed  Last Vitals:  Vitals Value Taken Time  BP 105/73 03/07/2018  9:19 AM  Temp    Pulse 72 03/07/2018  9:22 AM  Resp 12 03/07/2018  9:22 AM  SpO2 100 % 03/07/2018  9:22 AM  Vitals shown include unvalidated device data.  Last Pain:  Vitals:   03/07/18 0554  TempSrc:   PainSc: 9       Patients Stated Pain Goal: 3 (03/07/18 0554)  Complications: No apparent anesthesia complications

## 2018-03-08 ENCOUNTER — Encounter (HOSPITAL_COMMUNITY): Payer: Self-pay | Admitting: Neurological Surgery

## 2018-03-08 DIAGNOSIS — M5116 Intervertebral disc disorders with radiculopathy, lumbar region: Secondary | ICD-10-CM | POA: Diagnosis not present

## 2018-03-08 MED ORDER — METHOCARBAMOL 500 MG PO TABS: 500.0000 mg | ORAL_TABLET | Freq: Four times a day (QID) | ORAL | 0 refills | Status: DC | PRN

## 2018-03-08 MED ORDER — HYDROCODONE-ACETAMINOPHEN 10-325 MG PO TABS: 1.0000 | ORAL_TABLET | ORAL | 0 refills | Status: DC | PRN

## 2018-03-08 NOTE — Discharge Summary (Signed)
Physician Discharge Summary  Patient ID: Kathryn Golden MRN: [id number] DOB/AGE: [birth year] 42 y.o.  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 The patient tolerated the procedure well and was taken to the recovery room and then to the floor in stable condition. The hospital course was routine. There were no complications. The wound remained clean dry and intact. Pt had appropriate back soreness. No complaints of leg pain or new N/T/W. The patient remained afebrile with stable vital signs, and tolerated a regular diet. The patient continued to increase activities, and pain was well controlled with oral pain medications.   Consults: None  Significant Diagnostic Studies:  Results for orders placed or performed during the hospital encounter of 02/27/18  Surgical pcr screen  Result Value Ref Range   MRSA, PCR NEGATIVE NEGATIVE   Staphylococcus aureus NEGATIVE NEGATIVE  Basic metabolic panel  Result Value Ref Range   Sodium 140 135 - 145 mmol/L   Potassium 3.7 3.5 - 5.1 mmol/L   Chloride 106 101 - 111 mmol/L   CO2 29 22 - 32 mmol/L   Glucose, Bld 93 65 - 99 mg/dL   BUN 6 6 - 20 mg/dL   Creatinine, Ser 0.59 0.44 - 1.00 mg/dL   Calcium 9.0 8.9 - 10.3 mg/dL   GFR calc non Af Amer >60 >60 mL/min   GFR calc Af Amer >60 >60 mL/min   Anion gap 5 5 - 15  CBC WITH DIFFERENTIAL  Result Value Ref Range   WBC 5.8 4.0 - 10.5 K/uL   RBC 4.63 3.87 - 5.11 MIL/uL   Hemoglobin 13.9 12.0 - 15.0 g/dL   HCT 43.0 36.0 - 46.0 %   MCV 92.9 78.0 - 100.0 fL   MCH 30.0 26.0 - 34.0 pg   MCHC 32.3 30.0 - 36.0 g/dL   RDW 12.7 11.5 - 15.5 %   Platelets 195 150 - 400 K/uL   Neutrophils Relative % 47 %   Neutro Abs 2.8  1.7 - 7.7 K/uL   Lymphocytes Relative 42 %   Lymphs Abs 2.4 0.7 - 4.0 K/uL   Monocytes Relative 5 %   Monocytes Absolute 0.3 0.1 - 1.0 K/uL   Eosinophils Relative 5 %   Eosinophils Absolute 0.3 0.0 - 0.7 K/uL   Basophils Relative 1 %   Basophils Absolute 0.1 0.0 - 0.1 K/uL  Protime-INR  Result Value Ref Range   Prothrombin Time 12.5 11.4 - 15.2 seconds   INR 0.94     Chest 2 View  Result Date: 02/27/2018 CLINICAL DATA:  Preoperative evaluation for upcoming lumbar microdiscectomy EXAM: CHEST - 2 VIEW COMPARISON:  11/17/2004 FINDINGS: The heart size and mediastinal contours are within normal limits. Both lungs are clear. The visualized skeletal structures are unremarkable. IMPRESSION: No active cardiopulmonary disease. Electronically Signed   By: Inez Catalina M.D.   On: 02/27/2018 16:29   Dg Lumbar Spine 2-3 Views  Result Date: 03/07/2018 CLINICAL DATA:  L4-L5 microdiskectomy EXAM: LUMBAR SPINE - 2-3 VIEW COMPARISON:  Portable cross-table lateral intraoperative images are compared to prior MRI of 02/13/2018 FINDINGS: Prior MRI labeled with 5 lumbar vertebra. Image 1 at 7253 hours: Metallic probe via dorsal approach projects dorsal to the L4-L5 disc space, at the inferior margin  of the L4 spinous process. Image 2 at 0819 hours: Curved metallic probe via dorsal approach projects dorsal to the L4-L5 disc space. Tissue retractor present at L5. Image 3 at 0824 hours: Curved metallic probe via dorsal approach projects dorsal to the L4-L5 disc space. Tissue retractor present at L5. IMPRESSION: Dorsal localization of the L4-L5 disc space level as above. Electronically Signed   By: Lavonia Dana M.D.   On: 03/07/2018 10:37    Antibiotics:  Anti-infectives (From admission, onward)   Start     Dose/Rate Route Frequency Ordered Stop   03/07/18 1600  ceFAZolin (ANCEF) IVPB 2g/100 mL premix     2 g 200 mL/hr over 30 Minutes Intravenous Every 8 hours 03/07/18 1032 03/08/18 0001   03/07/18 0834  bacitracin  50,000 Units in sodium chloride 0.9 % 500 mL irrigation  Status:  Discontinued       As needed 03/07/18 0834 03/07/18 0914   03/07/18 0600  ceFAZolin (ANCEF) IVPB 2g/100 mL premix     2 g 200 mL/hr over 30 Minutes Intravenous On call to O.R. 03/07/18 0546 03/07/18 0805   03/07/18 0550  ceFAZolin (ANCEF) 2-4 GM/100ML-% IVPB    Note to Pharmacy:  Jasmine Pang   : cabinet override      03/07/18 0550 03/07/18 0750      Discharge Exam: Blood pressure 118/81, pulse 98, temperature 97.9 F (36.6 C), temperature source Oral, resp. rate 20, height _0  (1.727 m), weight 145 lb (65.8 kg), last menstrual period 07/23/2014, SpO2 100 %. Neurologic: Grossly normal Ambulating and voiding well, incision CDI  Discharge Medications:   Allergies as of 03/08/2018   No Known Allergies     Medication List    TAKE these medications   aspirin-acetaminophen-caffeine 250-250-65 MG tablet Commonly known as:  EXCEDRIN MIGRAINE Take 2 tablets by mouth daily as needed for headache.   B-12 2500 MCG Tabs Take 2,500 mcg by mouth daily.   baclofen 10 MG tablet Commonly known as:  LIORESAL Take 10 mg by mouth every 6 (six) hours as needed for muscle spasms.   Biotin 1000 MCG tablet Take 1,000 mcg by mouth daily.   butalbital-acetaminophen-caffeine 50-325-40 MG tablet Commonly known as:  FIORICET, ESGIC Take 1 tablet by mouth daily as needed for headache or migraine.   celecoxib 100 MG capsule Commonly known as:  CELEBREX Take 100 mg by mouth daily as needed for moderate pain.   clonazePAM 1 MG tablet Commonly known as:  KLONOPIN Take 1 mg by mouth 2 (two) times daily as needed for anxiety.   dicyclomine 10 MG capsule Commonly known as:  BENTYL Take 1 capsule (10 mg total) by mouth 4 (four) times daily -  before meals and at bedtime. What changed:    when to take this  reasons to take this   DULoxetine 20 MG capsule Commonly known as:  CYMBALTA Take 20 mg by mouth daily.   ferrous  sulfate 325 (65 FE) MG EC tablet Take 325 mg by mouth daily.   fluconazole 150 MG tablet Commonly known as:  DIFLUCAN 1 po stat; repeat in 3 days   HYDROcodone-acetaminophen 10-325 MG tablet Commonly known as:  NORCO Take 1 tablet by mouth every 6 (six) hours as needed for moderate pain. What changed:  Another medication with the same name was added. Make sure you understand how and when to take each.   HYDROcodone-acetaminophen 10-325 MG tablet Commonly known as:  NORCO Take 1 tablet by mouth every 4 (  four) hours as needed for moderate pain. What changed:  You were already taking a medication with the same name, and this prescription was added. Make sure you understand how and when to take each.   hyoscyamine 0.125 MG SL tablet Commonly known as:  LEVSIN SL Place 0.125 mg under the tongue 4 (four) times daily as needed for cramping.   methocarbamol 500 MG tablet Commonly known as:  ROBAXIN Take 500 mg by mouth daily as needed for muscle spasms. What changed:  Another medication with the same name was added. Make sure you understand how and when to take each.   methocarbamol 500 MG tablet Commonly known as:  ROBAXIN Take 1 tablet (500 mg total) by mouth every 6 (six) hours as needed for muscle spasms. What changed:  You were already taking a medication with the same name, and this prescription was added. Make sure you understand how and when to take each.   Na Sulfate-K Sulfate-Mg Sulf 17.5-3.13-1.6 GM/177ML Soln Commonly known as:  SUPREP BOWEL PREP KIT Take 1 kit by mouth as directed.   OVER THE COUNTER MEDICATION Take 250 mg by mouth daily. Keratin otc supplement   pantoprazole 40 MG tablet Commonly known as:  PROTONIX Take 40 mg by mouth daily as needed (acid reflux).   pregabalin 200 MG capsule Commonly known as:  LYRICA Take 200 mg by mouth at bedtime.   RABEprazole 20 MG tablet Commonly known as:  ACIPHEX Take 1 tablet (20 mg total) by mouth daily.    ranitidine 150 MG tablet Commonly known as:  ZANTAC Take 150 mg by mouth daily as needed for heartburn.   rizatriptan 10 MG tablet Commonly known as:  MAXALT Take 10 mg by mouth as needed for migraine. May repeat in 2 hours if needed   rOPINIRole 0.5 MG tablet Commonly known as:  REQUIP Take 0.5 mg by mouth at bedtime.   vitamin C 500 MG tablet Commonly known as:  ASCORBIC ACID Take 500 mg by mouth daily.   Vitamin D (Ergocalciferol) 50000 units Caps capsule Commonly known as:  DRISDOL Take 50,000 Units by mouth every Monday.       Disposition: home   Final [id number] L4-5 left  Discharge Instructions     Remove dressing in 72 hours   Complete by:  As directed    Call MD for:  difficulty breathing, headache or visual disturbances   Complete by:  As directed    Call MD for:  hives   Complete by:  As directed    Call MD for:  persistant dizziness or light-headedness   Complete by:  As directed    Call MD for:  persistant nausea and vomiting   Complete by:  As directed    Call MD for:  redness, tenderness, or signs of infection (pain, swelling, redness, odor or green/yellow discharge around incision site)   Complete by:  As directed    Call MD for:  severe uncontrolled pain   Complete by:  As directed    Call MD for:  temperature >100.4   Complete by:  As directed    Diet - low sodium heart healthy   Complete by:  As directed    Driving Restrictions   Complete by:  As directed    No driving 2 weeks   Increase activity slowly   Complete by:  As directed    Lifting restrictions   Complete by:  As directed    No lifting more than 8  lbs      Follow-up Information    Eustace Moore, MD. Schedule an appointment as soon as possible for a visit in 2 week(s).   Specialty:  Neurosurgery Contact information: [address] [place] [address] [place] [address] [phone]            Signed: Ocie Cornfield [facility] 03/08/2018, 7:46 AM

## 2018-03-08 NOTE — Progress Notes (Signed)
Patient alert and oriented, mae's well, voiding adequate amount of urine, swallowing without difficulty, c/o moderate pain and meds given prior to discharged for ride and discomfort. Patient discharged home with family. Script and discharged instructions given to patient and family stated understanding of instructions given.

## 2018-03-26 ENCOUNTER — Telehealth: Payer: Self-pay | Admitting: Gastroenterology

## 2018-03-26 ENCOUNTER — Encounter: Payer: Self-pay | Admitting: Internal Medicine

## 2018-03-26 ENCOUNTER — Ambulatory Visit: Payer: Medicaid Other | Admitting: Gastroenterology

## 2018-03-26 NOTE — Telephone Encounter (Signed)
PATIENT WAS A NO SHOW AND LETTER SENT  °

## 2018-04-13 ENCOUNTER — Other Ambulatory Visit: Payer: Self-pay | Admitting: Internal Medicine

## 2018-05-01 DIAGNOSIS — Z79899 Other long term (current) drug therapy: Secondary | ICD-10-CM | POA: Diagnosis not present

## 2018-05-01 DIAGNOSIS — M25511 Pain in right shoulder: Secondary | ICD-10-CM | POA: Diagnosis not present

## 2018-05-01 DIAGNOSIS — R000 Tachycardia, unspecified: Secondary | ICD-10-CM | POA: Diagnosis not present

## 2018-05-01 DIAGNOSIS — M79601 Pain in right arm: Secondary | ICD-10-CM | POA: Diagnosis not present

## 2018-05-06 ENCOUNTER — Other Ambulatory Visit: Payer: Self-pay | Admitting: Internal Medicine

## 2018-05-06 DIAGNOSIS — G2581 Restless legs syndrome: Secondary | ICD-10-CM | POA: Diagnosis not present

## 2018-05-06 DIAGNOSIS — M5416 Radiculopathy, lumbar region: Secondary | ICD-10-CM | POA: Diagnosis not present

## 2018-05-06 DIAGNOSIS — M797 Fibromyalgia: Secondary | ICD-10-CM | POA: Diagnosis not present

## 2018-05-06 DIAGNOSIS — M545 Low back pain: Secondary | ICD-10-CM | POA: Diagnosis not present

## 2018-05-06 DIAGNOSIS — M5010 Cervical disc disorder with radiculopathy, unspecified cervical region: Secondary | ICD-10-CM | POA: Diagnosis not present

## 2018-05-06 DIAGNOSIS — Z79891 Long term (current) use of opiate analgesic: Secondary | ICD-10-CM | POA: Diagnosis not present

## 2018-05-06 NOTE — Telephone Encounter (Signed)
Received request from pharmacy for Levsin.  Please check with patient to make sure she would like this prescription.  According to March 2019 note by Dr. Jena GaussoUrk, Levsin was not helping and she was switched to Bentyl.     She cannot take both at the same time, she has to take levsin OR bentyl.

## 2018-05-07 NOTE — Telephone Encounter (Signed)
Lmom, waiting on a return call.  

## 2018-05-08 ENCOUNTER — Other Ambulatory Visit (HOSPITAL_COMMUNITY): Payer: Self-pay | Admitting: Neurology

## 2018-05-08 DIAGNOSIS — M5412 Radiculopathy, cervical region: Secondary | ICD-10-CM

## 2018-05-09 NOTE — Telephone Encounter (Signed)
LMOM for a return call.  

## 2018-05-16 ENCOUNTER — Ambulatory Visit (HOSPITAL_COMMUNITY): Payer: Medicaid Other

## 2018-05-21 ENCOUNTER — Other Ambulatory Visit (HOSPITAL_COMMUNITY): Payer: Self-pay | Admitting: Neurology

## 2018-05-21 DIAGNOSIS — M5412 Radiculopathy, cervical region: Secondary | ICD-10-CM

## 2018-05-24 ENCOUNTER — Ambulatory Visit (HOSPITAL_COMMUNITY)
Admission: RE | Admit: 2018-05-24 | Discharge: 2018-05-24 | Disposition: A | Discharge status: Home / Self Care | Payer: Medicaid Other | Source: Ambulatory Visit | Attending: Neurology | Admitting: Neurology

## 2018-05-24 DIAGNOSIS — M50221 Other cervical disc displacement at C4-C5 level: Secondary | ICD-10-CM | POA: Diagnosis not present

## 2018-05-24 DIAGNOSIS — M4802 Spinal stenosis, cervical region: Secondary | ICD-10-CM | POA: Insufficient documentation

## 2018-05-24 DIAGNOSIS — M542 Cervicalgia: Secondary | ICD-10-CM | POA: Diagnosis not present

## 2018-05-24 DIAGNOSIS — M5412 Radiculopathy, cervical region: Secondary | ICD-10-CM | POA: Insufficient documentation

## 2018-05-24 DIAGNOSIS — M50321 Other cervical disc degeneration at C4-C5 level: Secondary | ICD-10-CM | POA: Insufficient documentation

## 2018-05-31 DIAGNOSIS — G43711 Chronic migraine without aura, intractable, with status migrainosus: Secondary | ICD-10-CM | POA: Diagnosis not present

## 2018-06-19 NOTE — Telephone Encounter (Signed)
Pt didn't call back as requested to discuss medication. Closing note out.

## 2018-07-08 DIAGNOSIS — M5010 Cervical disc disorder with radiculopathy, unspecified cervical region: Secondary | ICD-10-CM | POA: Diagnosis not present

## 2018-07-08 DIAGNOSIS — G2581 Restless legs syndrome: Secondary | ICD-10-CM | POA: Diagnosis not present

## 2018-07-08 DIAGNOSIS — M797 Fibromyalgia: Secondary | ICD-10-CM | POA: Diagnosis not present

## 2018-07-08 DIAGNOSIS — Z79891 Long term (current) use of opiate analgesic: Secondary | ICD-10-CM | POA: Diagnosis not present

## 2018-07-10 ENCOUNTER — Other Ambulatory Visit: Payer: Self-pay | Admitting: Neurology

## 2018-07-10 DIAGNOSIS — M5412 Radiculopathy, cervical region: Secondary | ICD-10-CM

## 2018-08-16 ENCOUNTER — Ambulatory Visit
Admission: RE | Admit: 2018-08-16 | Discharge: 2018-08-16 | Disposition: A | Discharge status: Home / Self Care | Payer: Medicaid Other | Source: Ambulatory Visit | Attending: Neurology | Admitting: Neurology

## 2018-08-16 DIAGNOSIS — M5412 Radiculopathy, cervical region: Secondary | ICD-10-CM

## 2018-08-16 DIAGNOSIS — M47812 Spondylosis without myelopathy or radiculopathy, cervical region: Secondary | ICD-10-CM | POA: Diagnosis not present

## 2018-08-16 MED ORDER — TRIAMCINOLONE ACETONIDE 40 MG/ML IJ SUSP (RADIOLOGY)
60.0000 mg | Freq: Once | INTRAMUSCULAR | Status: AC
  Administered 2018-08-16: 60 mg via EPIDURAL

## 2018-08-16 MED ORDER — IOPAMIDOL (ISOVUE-M 300) INJECTION 61%
1.0000 mL | Freq: Once | INTRAMUSCULAR | Status: AC | PRN
  Administered 2018-08-16: 1 mL via EPIDURAL

## 2018-09-05 DIAGNOSIS — G2581 Restless legs syndrome: Secondary | ICD-10-CM | POA: Diagnosis not present

## 2018-09-05 DIAGNOSIS — Z79891 Long term (current) use of opiate analgesic: Secondary | ICD-10-CM | POA: Diagnosis not present

## 2018-09-05 DIAGNOSIS — M5010 Cervical disc disorder with radiculopathy, unspecified cervical region: Secondary | ICD-10-CM | POA: Diagnosis not present

## 2018-09-05 DIAGNOSIS — M797 Fibromyalgia: Secondary | ICD-10-CM | POA: Diagnosis not present

## 2018-09-06 DIAGNOSIS — F419 Anxiety disorder, unspecified: Secondary | ICD-10-CM | POA: Diagnosis not present

## 2018-09-06 DIAGNOSIS — Z79899 Other long term (current) drug therapy: Secondary | ICD-10-CM | POA: Diagnosis not present

## 2018-09-06 DIAGNOSIS — R000 Tachycardia, unspecified: Secondary | ICD-10-CM | POA: Diagnosis not present

## 2018-09-06 DIAGNOSIS — K219 Gastro-esophageal reflux disease without esophagitis: Secondary | ICD-10-CM | POA: Diagnosis not present

## 2018-10-10 DIAGNOSIS — G43711 Chronic migraine without aura, intractable, with status migrainosus: Secondary | ICD-10-CM | POA: Diagnosis not present

## 2018-10-31 DIAGNOSIS — M797 Fibromyalgia: Secondary | ICD-10-CM | POA: Diagnosis not present

## 2018-10-31 DIAGNOSIS — Z79891 Long term (current) use of opiate analgesic: Secondary | ICD-10-CM | POA: Diagnosis not present

## 2018-10-31 DIAGNOSIS — G43701 Chronic migraine without aura, not intractable, with status migrainosus: Secondary | ICD-10-CM | POA: Diagnosis not present

## 2018-10-31 DIAGNOSIS — M5010 Cervical disc disorder with radiculopathy, unspecified cervical region: Secondary | ICD-10-CM | POA: Diagnosis not present

## 2018-12-23 IMAGING — MR MR CERVICAL SPINE W/O CM
4 of 5 series · 14 of 48 positions shown · non-contrast
Comparison: Cervical spine MRI 10/20/2015.

CLINICAL DATA: 41-year-old female with 2-3 months of neck pain
radiating to the right arm with weakness. No known injury.

EXAM:
MRI CERVICAL SPINE WITHOUT CONTRAST
TECHNIQUE: Multiplanar, multisequence MR imaging of the cervical spine was
performed. No intravenous contrast was administered.

[Series 3: T2 · sagittal · 3.0mm · 0.39mm/px · 5 of 15 slices shown (1 of 2)]
[im 1/15]
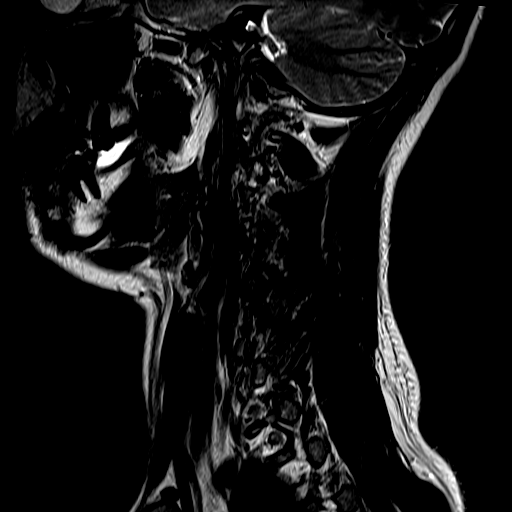
[im 3/15]
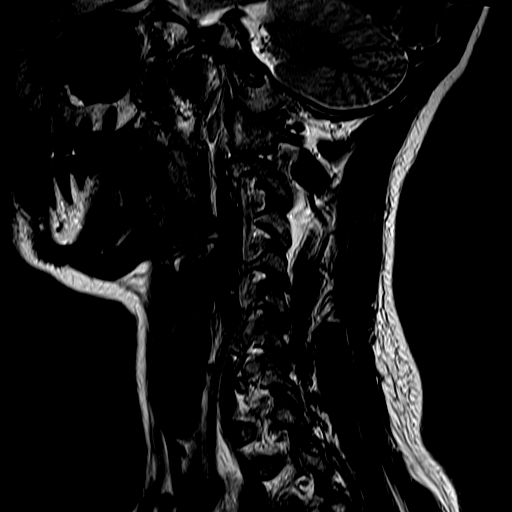
[im 5/15]
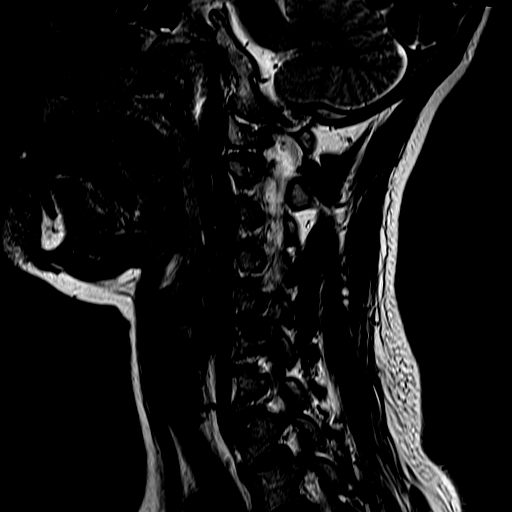
[im 8/15]
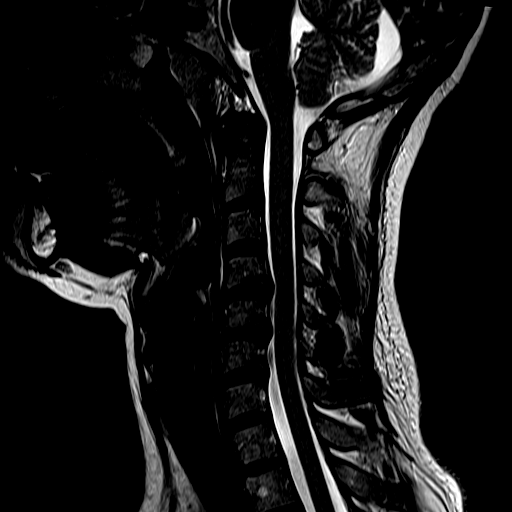
[im 12/15]
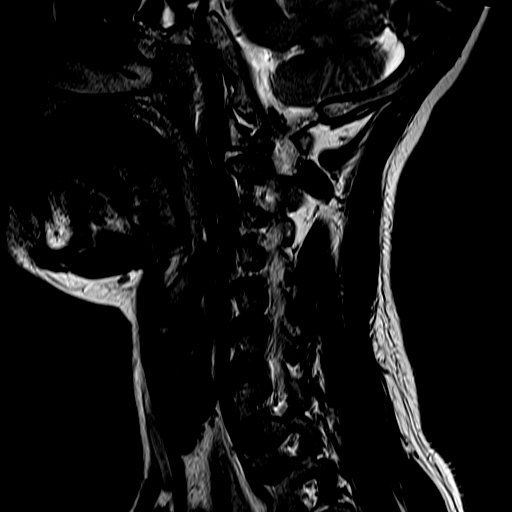

[Series 4: FLAIR · sagittal · 3.0mm · 0.45mm/px · 3 of 15 slices shown]
[im 3/15]
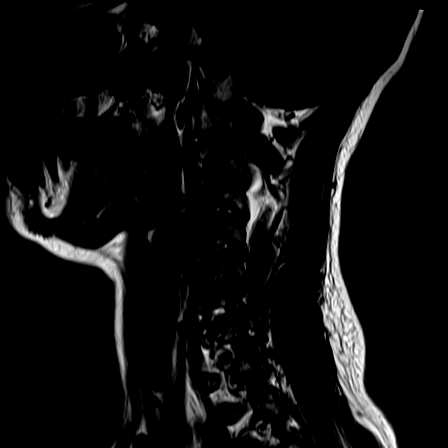
[im 9/15]
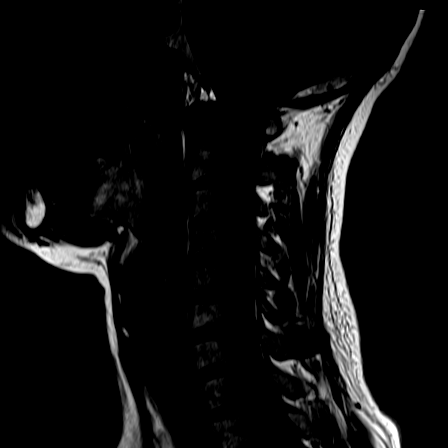
[im 15/15]
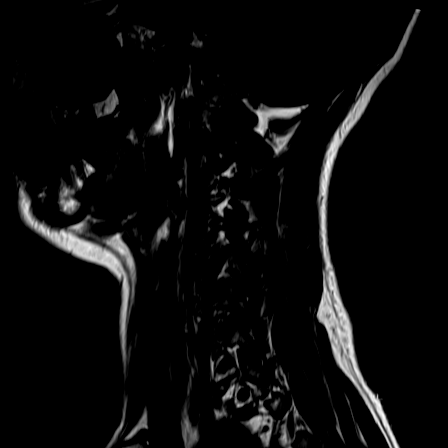

[Series 5: ir sagital · sagittal · 3.0mm · 0.22mm/px · 3 of 15 slices shown]
[im 3/15]
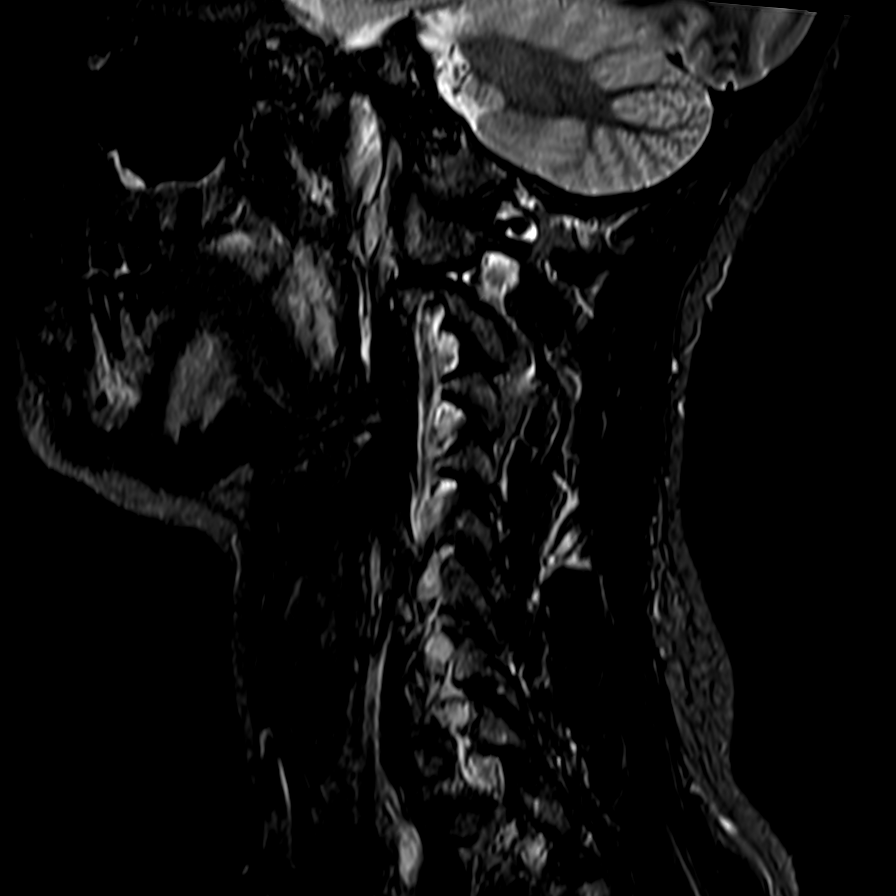
[im 9/15]
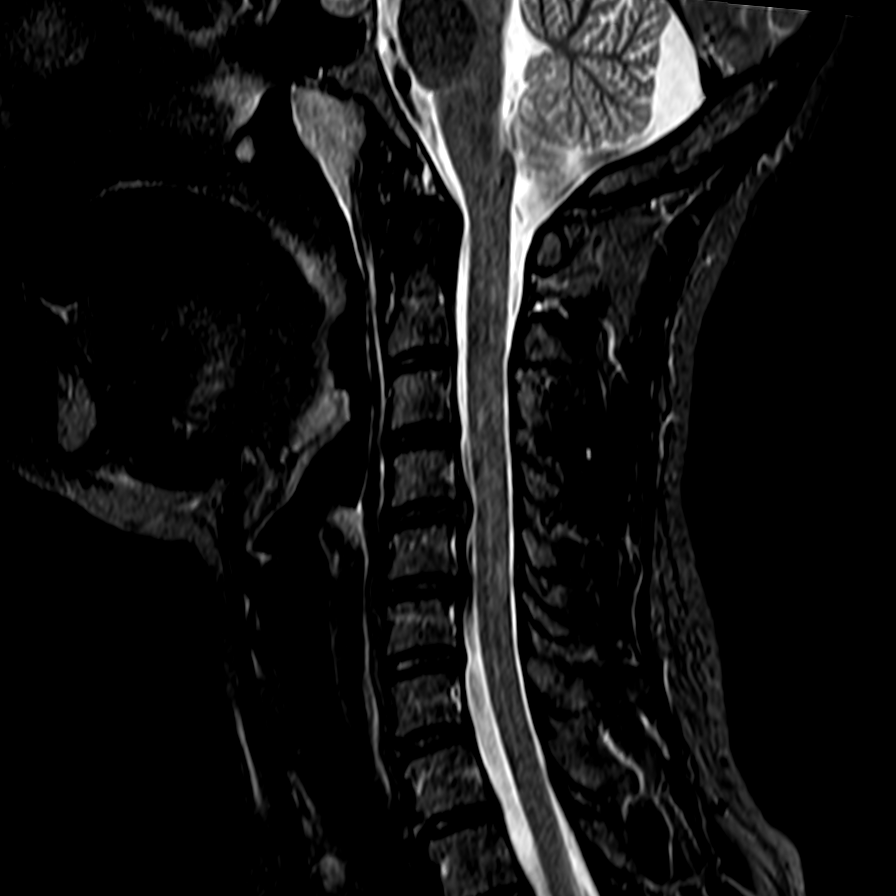
[im 15/15]
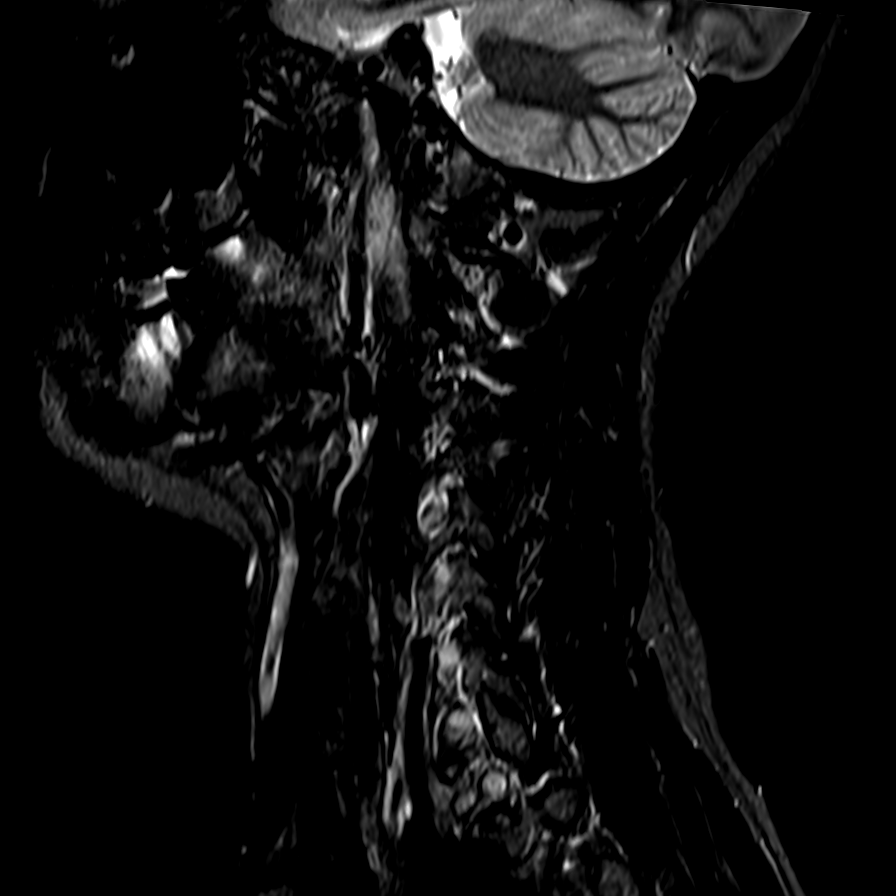

[Series 7: T2 · axial · 3.0mm · 0.20mm/px · z∈[-24,+53]mm · 3 of 33 slices shown (2 of 2)]
[im 5/33]
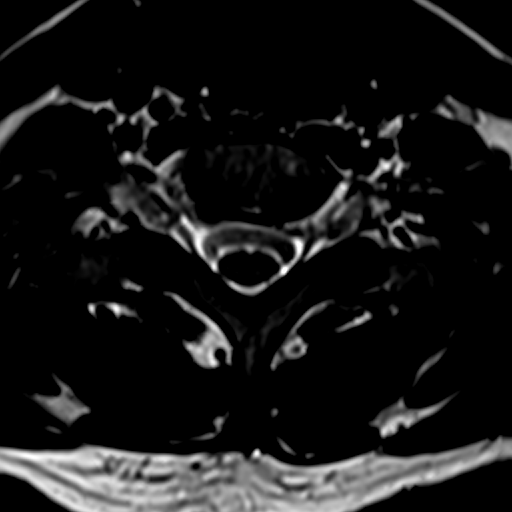
[im 18/33]
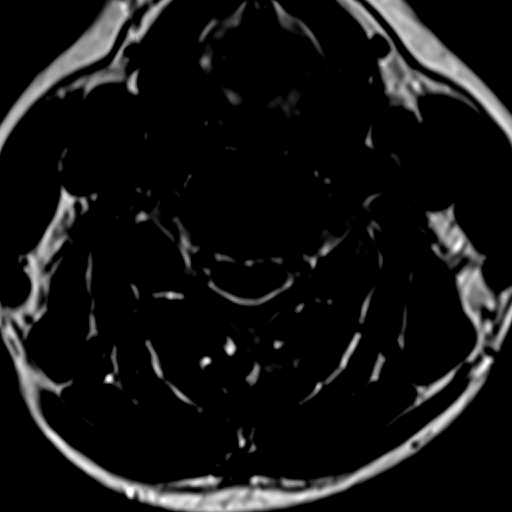
[im 28/33]
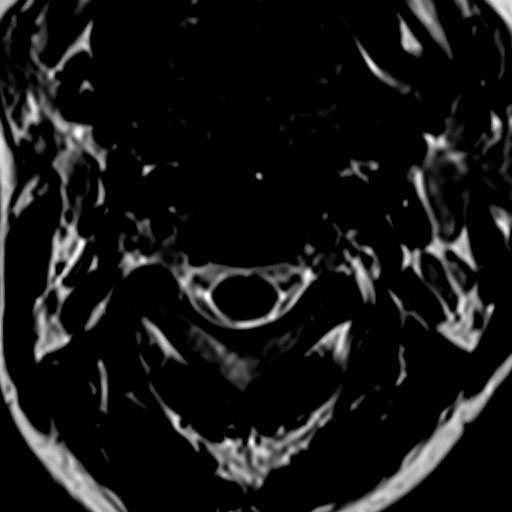

[14 of 48 positions shown; findings below may reference images not displayed]

FINDINGS: Alignment: Stable straightening of cervical lordosis. Stable
vertebral height and alignment since 5976.

Vertebrae: No marrow edema or evidence of acute osseous abnormality.
Visualized bone marrow signal is within normal limits.

Cord: Spinal cord signal is within normal limits at all visualized
levels.

Posterior Fossa, vertebral arteries, paraspinal tissues: Negative
visible posterior fossa and brain parenchyma. Preserved major
vascular flow voids in the neck. Negative neck soft tissues.
Negative visible lung apices.

Disc levels:

C2-C3:  Negative.

C3-C4:  Negative.

C4-C5: Chronic mild circumferential disc bulging. A central to right
paracentral disc herniation with annular fissure appears mildly
regressed since 5976 (series 6, image 16 today versus series 6,
image 20 previously). There remains mild mass effect on the ventral
thecal sac and mild spinal stenosis. No foraminal involvement
identified.

C5-C6: Chronic circumferential disc bulge. Mildly lobulated
broad-based posterior component of disc appears stable although
right neural foraminal involvement may have increased (series 7,
image 20). Superimposed foraminal endplate spurring. Stable mild
spinal stenosis without spinal cord mass effect. Stable mild left C6
foraminal stenosis while right C6 foraminal stenosis appears
somewhat increased and moderate to severe.

C6-C7:  Negative.

C7-T1:  Negative disc.  Mild facet hypertrophy.  No stenosis.

T1-T2: Mild foraminal endplate spurring greater on the right. New
borderline to mild right T1 foraminal stenosis.
IMPRESSION: 1. Chronic disc and endplate degeneration at C5-C6. Disc material
may now involve more of the right neural foramen. Query right C6
radiculitis. Stable mild spinal stenosis.
2. Chronic C4-C5 disc degeneration with mildly progressed disc
herniation since 5976. Mild spinal stenosis and cord mass effect at
this level are stable. No cord signal abnormality.
3. Mild degeneration at C7-T1 and T1-T2.

## 2018-12-26 DIAGNOSIS — G43701 Chronic migraine without aura, not intractable, with status migrainosus: Secondary | ICD-10-CM | POA: Diagnosis not present

## 2018-12-26 DIAGNOSIS — Z79891 Long term (current) use of opiate analgesic: Secondary | ICD-10-CM | POA: Diagnosis not present

## 2018-12-26 DIAGNOSIS — M797 Fibromyalgia: Secondary | ICD-10-CM | POA: Diagnosis not present

## 2018-12-26 DIAGNOSIS — M5010 Cervical disc disorder with radiculopathy, unspecified cervical region: Secondary | ICD-10-CM | POA: Diagnosis not present

## 2019-02-06 DIAGNOSIS — M5010 Cervical disc disorder with radiculopathy, unspecified cervical region: Secondary | ICD-10-CM | POA: Diagnosis not present

## 2019-02-06 DIAGNOSIS — M545 Low back pain: Secondary | ICD-10-CM | POA: Diagnosis not present

## 2019-02-06 DIAGNOSIS — M797 Fibromyalgia: Secondary | ICD-10-CM | POA: Diagnosis not present

## 2019-02-06 DIAGNOSIS — Z79891 Long term (current) use of opiate analgesic: Secondary | ICD-10-CM | POA: Diagnosis not present

## 2019-02-06 DIAGNOSIS — M5416 Radiculopathy, lumbar region: Secondary | ICD-10-CM | POA: Diagnosis not present

## 2019-02-06 DIAGNOSIS — G43701 Chronic migraine without aura, not intractable, with status migrainosus: Secondary | ICD-10-CM | POA: Diagnosis not present

## 2019-02-14 DIAGNOSIS — G43711 Chronic migraine without aura, intractable, with status migrainosus: Secondary | ICD-10-CM | POA: Diagnosis not present

## 2019-03-11 DIAGNOSIS — M5010 Cervical disc disorder with radiculopathy, unspecified cervical region: Secondary | ICD-10-CM | POA: Diagnosis not present

## 2019-03-11 DIAGNOSIS — Z79891 Long term (current) use of opiate analgesic: Secondary | ICD-10-CM | POA: Diagnosis not present

## 2019-03-11 DIAGNOSIS — G43701 Chronic migraine without aura, not intractable, with status migrainosus: Secondary | ICD-10-CM | POA: Diagnosis not present

## 2019-03-11 DIAGNOSIS — M797 Fibromyalgia: Secondary | ICD-10-CM | POA: Diagnosis not present

## 2019-03-28 DIAGNOSIS — R569 Unspecified convulsions: Secondary | ICD-10-CM | POA: Diagnosis not present

## 2019-05-02 DIAGNOSIS — G43711 Chronic migraine without aura, intractable, with status migrainosus: Secondary | ICD-10-CM | POA: Diagnosis not present

## 2019-05-30 DIAGNOSIS — G43701 Chronic migraine without aura, not intractable, with status migrainosus: Secondary | ICD-10-CM | POA: Diagnosis not present

## 2019-05-30 DIAGNOSIS — G2581 Restless legs syndrome: Secondary | ICD-10-CM | POA: Diagnosis not present

## 2019-05-30 DIAGNOSIS — M545 Low back pain: Secondary | ICD-10-CM | POA: Diagnosis not present

## 2019-05-30 DIAGNOSIS — M5416 Radiculopathy, lumbar region: Secondary | ICD-10-CM | POA: Diagnosis not present

## 2019-05-30 DIAGNOSIS — Z79891 Long term (current) use of opiate analgesic: Secondary | ICD-10-CM | POA: Diagnosis not present

## 2019-05-30 DIAGNOSIS — M5010 Cervical disc disorder with radiculopathy, unspecified cervical region: Secondary | ICD-10-CM | POA: Diagnosis not present

## 2019-06-27 DIAGNOSIS — Z79891 Long term (current) use of opiate analgesic: Secondary | ICD-10-CM | POA: Diagnosis not present

## 2019-06-27 DIAGNOSIS — G43701 Chronic migraine without aura, not intractable, with status migrainosus: Secondary | ICD-10-CM | POA: Diagnosis not present

## 2019-06-27 DIAGNOSIS — M797 Fibromyalgia: Secondary | ICD-10-CM | POA: Diagnosis not present

## 2019-06-27 DIAGNOSIS — M5010 Cervical disc disorder with radiculopathy, unspecified cervical region: Secondary | ICD-10-CM | POA: Diagnosis not present

## 2019-07-31 ENCOUNTER — Other Ambulatory Visit: Payer: Self-pay | Admitting: Internal Medicine

## 2019-08-22 DIAGNOSIS — M797 Fibromyalgia: Secondary | ICD-10-CM | POA: Diagnosis not present

## 2019-08-22 DIAGNOSIS — M545 Low back pain: Secondary | ICD-10-CM | POA: Diagnosis not present

## 2019-08-22 DIAGNOSIS — Z79891 Long term (current) use of opiate analgesic: Secondary | ICD-10-CM | POA: Diagnosis not present

## 2019-08-22 DIAGNOSIS — G43701 Chronic migraine without aura, not intractable, with status migrainosus: Secondary | ICD-10-CM | POA: Diagnosis not present

## 2019-09-23 ENCOUNTER — Other Ambulatory Visit (HOSPITAL_COMMUNITY)
Admission: RE | Admit: 2019-09-23 | Discharge: 2019-09-23 | Disposition: A | Discharge status: Home / Self Care | Payer: Medicaid Other | Source: Ambulatory Visit | Attending: Obstetrics & Gynecology | Admitting: Obstetrics & Gynecology

## 2019-09-23 ENCOUNTER — Other Ambulatory Visit (INDEPENDENT_AMBULATORY_CARE_PROVIDER_SITE_OTHER): Payer: Medicaid Other | Admitting: *Deleted

## 2019-09-23 ENCOUNTER — Other Ambulatory Visit: Payer: Self-pay

## 2019-09-23 DIAGNOSIS — Z113 Encounter for screening for infections with a predominantly sexual mode of transmission: Secondary | ICD-10-CM

## 2019-09-23 MED ORDER — FLUCONAZOLE 150 MG PO TABS: ORAL_TABLET | ORAL | 2 refills | Status: DC

## 2019-09-23 NOTE — Progress Notes (Signed)
   NURSE VISIT- VAGINITIS/STD/POC  SUBJECTIVE:  Kathryn Golden is a 43 y.o. [id number] GYN patientfemale here for a vaginal swab for STD screen.  She reports the following symptoms: burning, discharge described as creamy, odor and vulvar itching for 2 weeks. Denies abnormal vaginal bleeding, significant pelvic pain, fever, or UTI symptoms.  OBJECTIVE:  LMP 07/23/2014   Appears well, in no apparent distress  ASSESSMENT: Vaginal swab for STD screen  PLAN: Self-collected vaginal probe for Gonorrhea, Chlamydia, Trichomonas sent to lab Treatment: to be determined once results are received Follow-up as needed if symptoms persist/worsen, or new symptoms develop  Kathryn Golden, Celene Squibb  09/23/2019 2:40 PM

## 2019-09-23 NOTE — Progress Notes (Signed)
Will rx diflucan Meds ordered this encounter  Medications  . fluconazole (DIFLUCAN) 150 MG tablet    Sig: 1 po stat; repeat in 3 days    Dispense:  2 tablet    Refill:  2    Order Specific Question:   Supervising Provider    Answer:   Florian Buff [[id number]]  Chart reviewed for nurse visit. Agree with plan of care.  Estill Dooms, NP 09/23/2019 3:08 PM

## 2019-09-23 NOTE — Addendum Note (Signed)
Addended by: Derrek Monaco A on: 09/23/2019 03:08 PM   Modules accepted: Orders

## 2019-09-25 ENCOUNTER — Telehealth: Payer: Self-pay | Admitting: *Deleted

## 2019-09-25 LAB — CERVICOVAGINAL ANCILLARY ONLY
Chlamydia: NEGATIVE
Comment: NEGATIVE
Comment: NEGATIVE
Comment: NORMAL
Neisseria Gonorrhea: NEGATIVE
Trichomonas: NEGATIVE

## 2019-09-25 NOTE — Telephone Encounter (Signed)
Patient called wanting test results.

## 2019-09-25 NOTE — Telephone Encounter (Signed)
Telephoned patient at home number and advised patient results were not back. Advised patient to check my chart also and would be able to see results.

## 2019-10-28 DIAGNOSIS — G43701 Chronic migraine without aura, not intractable, with status migrainosus: Secondary | ICD-10-CM | POA: Diagnosis not present

## 2019-10-28 DIAGNOSIS — M5010 Cervical disc disorder with radiculopathy, unspecified cervical region: Secondary | ICD-10-CM | POA: Diagnosis not present

## 2019-10-28 DIAGNOSIS — Z79891 Long term (current) use of opiate analgesic: Secondary | ICD-10-CM | POA: Diagnosis not present

## 2019-10-28 DIAGNOSIS — M797 Fibromyalgia: Secondary | ICD-10-CM | POA: Diagnosis not present

## 2019-12-19 DIAGNOSIS — M797 Fibromyalgia: Secondary | ICD-10-CM | POA: Diagnosis not present

## 2019-12-19 DIAGNOSIS — M5416 Radiculopathy, lumbar region: Secondary | ICD-10-CM | POA: Diagnosis not present

## 2019-12-19 DIAGNOSIS — G2581 Restless legs syndrome: Secondary | ICD-10-CM | POA: Diagnosis not present

## 2019-12-19 DIAGNOSIS — M545 Low back pain: Secondary | ICD-10-CM | POA: Diagnosis not present

## 2019-12-19 DIAGNOSIS — Z79891 Long term (current) use of opiate analgesic: Secondary | ICD-10-CM | POA: Diagnosis not present

## 2019-12-19 DIAGNOSIS — M5010 Cervical disc disorder with radiculopathy, unspecified cervical region: Secondary | ICD-10-CM | POA: Diagnosis not present

## 2019-12-19 DIAGNOSIS — G43701 Chronic migraine without aura, not intractable, with status migrainosus: Secondary | ICD-10-CM | POA: Diagnosis not present

## 2020-02-16 ENCOUNTER — Other Ambulatory Visit (HOSPITAL_COMMUNITY): Payer: Self-pay | Admitting: Neurology

## 2020-02-16 DIAGNOSIS — M25569 Pain in unspecified knee: Secondary | ICD-10-CM

## 2020-02-16 DIAGNOSIS — F419 Anxiety disorder, unspecified: Secondary | ICD-10-CM | POA: Diagnosis not present

## 2020-02-16 DIAGNOSIS — R5383 Other fatigue: Secondary | ICD-10-CM | POA: Diagnosis not present

## 2020-02-16 DIAGNOSIS — G43709 Chronic migraine without aura, not intractable, without status migrainosus: Secondary | ICD-10-CM | POA: Diagnosis not present

## 2020-02-16 DIAGNOSIS — M545 Low back pain: Secondary | ICD-10-CM | POA: Diagnosis not present

## 2020-02-16 DIAGNOSIS — M5416 Radiculopathy, lumbar region: Secondary | ICD-10-CM | POA: Diagnosis not present

## 2020-02-16 DIAGNOSIS — M5010 Cervical disc disorder with radiculopathy, unspecified cervical region: Secondary | ICD-10-CM | POA: Diagnosis not present

## 2020-02-16 DIAGNOSIS — M797 Fibromyalgia: Secondary | ICD-10-CM | POA: Diagnosis not present

## 2020-02-16 DIAGNOSIS — I10 Essential (primary) hypertension: Secondary | ICD-10-CM | POA: Diagnosis not present

## 2020-02-16 DIAGNOSIS — E559 Vitamin D deficiency, unspecified: Secondary | ICD-10-CM | POA: Diagnosis not present

## 2020-02-16 DIAGNOSIS — Z79891 Long term (current) use of opiate analgesic: Secondary | ICD-10-CM | POA: Diagnosis not present

## 2020-02-16 DIAGNOSIS — G2581 Restless legs syndrome: Secondary | ICD-10-CM | POA: Diagnosis not present

## 2020-02-18 ENCOUNTER — Other Ambulatory Visit (HOSPITAL_COMMUNITY): Payer: Self-pay | Admitting: Neurology

## 2020-02-18 DIAGNOSIS — M25522 Pain in left elbow: Secondary | ICD-10-CM

## 2020-02-18 DIAGNOSIS — M25532 Pain in left wrist: Secondary | ICD-10-CM

## 2020-02-18 DIAGNOSIS — M25531 Pain in right wrist: Secondary | ICD-10-CM

## 2020-02-18 DIAGNOSIS — M25561 Pain in right knee: Secondary | ICD-10-CM

## 2020-02-18 DIAGNOSIS — M25521 Pain in right elbow: Secondary | ICD-10-CM

## 2020-04-12 DIAGNOSIS — M5416 Radiculopathy, lumbar region: Secondary | ICD-10-CM | POA: Diagnosis not present

## 2020-04-12 DIAGNOSIS — G43709 Chronic migraine without aura, not intractable, without status migrainosus: Secondary | ICD-10-CM | POA: Diagnosis not present

## 2020-04-12 DIAGNOSIS — I10 Essential (primary) hypertension: Secondary | ICD-10-CM | POA: Diagnosis not present

## 2020-04-12 DIAGNOSIS — M25569 Pain in unspecified knee: Secondary | ICD-10-CM | POA: Diagnosis not present

## 2020-04-12 DIAGNOSIS — E559 Vitamin D deficiency, unspecified: Secondary | ICD-10-CM | POA: Diagnosis not present

## 2020-04-12 DIAGNOSIS — M545 Low back pain: Secondary | ICD-10-CM | POA: Diagnosis not present

## 2020-04-12 DIAGNOSIS — Z79891 Long term (current) use of opiate analgesic: Secondary | ICD-10-CM | POA: Diagnosis not present

## 2020-04-12 DIAGNOSIS — F419 Anxiety disorder, unspecified: Secondary | ICD-10-CM | POA: Diagnosis not present

## 2020-04-12 DIAGNOSIS — M797 Fibromyalgia: Secondary | ICD-10-CM | POA: Diagnosis not present

## 2020-04-12 DIAGNOSIS — R5383 Other fatigue: Secondary | ICD-10-CM | POA: Diagnosis not present

## 2020-04-12 DIAGNOSIS — G2581 Restless legs syndrome: Secondary | ICD-10-CM | POA: Diagnosis not present

## 2020-04-12 DIAGNOSIS — M5010 Cervical disc disorder with radiculopathy, unspecified cervical region: Secondary | ICD-10-CM | POA: Diagnosis not present

## 2020-07-05 DIAGNOSIS — M797 Fibromyalgia: Secondary | ICD-10-CM | POA: Diagnosis not present

## 2020-07-05 DIAGNOSIS — M25569 Pain in unspecified knee: Secondary | ICD-10-CM | POA: Diagnosis not present

## 2020-07-05 DIAGNOSIS — G43709 Chronic migraine without aura, not intractable, without status migrainosus: Secondary | ICD-10-CM | POA: Diagnosis not present

## 2020-07-05 DIAGNOSIS — M5010 Cervical disc disorder with radiculopathy, unspecified cervical region: Secondary | ICD-10-CM | POA: Diagnosis not present

## 2020-07-05 DIAGNOSIS — G2581 Restless legs syndrome: Secondary | ICD-10-CM | POA: Diagnosis not present

## 2020-07-05 DIAGNOSIS — M5416 Radiculopathy, lumbar region: Secondary | ICD-10-CM | POA: Diagnosis not present

## 2020-07-05 DIAGNOSIS — F419 Anxiety disorder, unspecified: Secondary | ICD-10-CM | POA: Diagnosis not present

## 2020-07-05 DIAGNOSIS — M545 Low back pain: Secondary | ICD-10-CM | POA: Diagnosis not present

## 2020-07-05 DIAGNOSIS — R5383 Other fatigue: Secondary | ICD-10-CM | POA: Diagnosis not present

## 2020-07-05 DIAGNOSIS — E559 Vitamin D deficiency, unspecified: Secondary | ICD-10-CM | POA: Diagnosis not present

## 2020-07-05 DIAGNOSIS — Z79891 Long term (current) use of opiate analgesic: Secondary | ICD-10-CM | POA: Diagnosis not present

## 2020-07-05 DIAGNOSIS — I10 Essential (primary) hypertension: Secondary | ICD-10-CM | POA: Diagnosis not present

## 2020-09-28 DIAGNOSIS — M5416 Radiculopathy, lumbar region: Secondary | ICD-10-CM | POA: Diagnosis not present

## 2020-09-28 DIAGNOSIS — F419 Anxiety disorder, unspecified: Secondary | ICD-10-CM | POA: Diagnosis not present

## 2020-09-28 DIAGNOSIS — M797 Fibromyalgia: Secondary | ICD-10-CM | POA: Diagnosis not present

## 2020-09-28 DIAGNOSIS — Z79891 Long term (current) use of opiate analgesic: Secondary | ICD-10-CM | POA: Diagnosis not present

## 2020-09-28 DIAGNOSIS — M25569 Pain in unspecified knee: Secondary | ICD-10-CM | POA: Diagnosis not present

## 2020-09-28 DIAGNOSIS — G43709 Chronic migraine without aura, not intractable, without status migrainosus: Secondary | ICD-10-CM | POA: Diagnosis not present

## 2020-09-28 DIAGNOSIS — M545 Low back pain, unspecified: Secondary | ICD-10-CM | POA: Diagnosis not present

## 2020-09-28 DIAGNOSIS — M5010 Cervical disc disorder with radiculopathy, unspecified cervical region: Secondary | ICD-10-CM | POA: Diagnosis not present

## 2020-09-28 DIAGNOSIS — E559 Vitamin D deficiency, unspecified: Secondary | ICD-10-CM | POA: Diagnosis not present

## 2020-09-28 DIAGNOSIS — G2581 Restless legs syndrome: Secondary | ICD-10-CM | POA: Diagnosis not present

## 2020-09-28 DIAGNOSIS — I10 Essential (primary) hypertension: Secondary | ICD-10-CM | POA: Diagnosis not present

## 2020-09-28 DIAGNOSIS — R5383 Other fatigue: Secondary | ICD-10-CM | POA: Diagnosis not present

## 2020-11-10 ENCOUNTER — Other Ambulatory Visit (HOSPITAL_COMMUNITY): Payer: Self-pay | Admitting: Neurology

## 2020-11-10 DIAGNOSIS — Z79891 Long term (current) use of opiate analgesic: Secondary | ICD-10-CM | POA: Diagnosis not present

## 2020-11-10 DIAGNOSIS — R079 Chest pain, unspecified: Secondary | ICD-10-CM

## 2020-11-10 DIAGNOSIS — F419 Anxiety disorder, unspecified: Secondary | ICD-10-CM | POA: Diagnosis not present

## 2020-11-10 DIAGNOSIS — E559 Vitamin D deficiency, unspecified: Secondary | ICD-10-CM | POA: Diagnosis not present

## 2020-11-10 DIAGNOSIS — M545 Low back pain, unspecified: Secondary | ICD-10-CM | POA: Diagnosis not present

## 2020-11-10 DIAGNOSIS — M5416 Radiculopathy, lumbar region: Secondary | ICD-10-CM | POA: Diagnosis not present

## 2020-11-10 DIAGNOSIS — R5383 Other fatigue: Secondary | ICD-10-CM | POA: Diagnosis not present

## 2020-11-10 DIAGNOSIS — M797 Fibromyalgia: Secondary | ICD-10-CM | POA: Diagnosis not present

## 2020-11-10 DIAGNOSIS — G2581 Restless legs syndrome: Secondary | ICD-10-CM | POA: Diagnosis not present

## 2020-11-10 DIAGNOSIS — M5010 Cervical disc disorder with radiculopathy, unspecified cervical region: Secondary | ICD-10-CM | POA: Diagnosis not present

## 2020-11-10 DIAGNOSIS — M25569 Pain in unspecified knee: Secondary | ICD-10-CM | POA: Diagnosis not present

## 2020-11-10 DIAGNOSIS — I10 Essential (primary) hypertension: Secondary | ICD-10-CM | POA: Diagnosis not present

## 2020-11-10 DIAGNOSIS — G43709 Chronic migraine without aura, not intractable, without status migrainosus: Secondary | ICD-10-CM | POA: Diagnosis not present

## 2021-01-31 DIAGNOSIS — F419 Anxiety disorder, unspecified: Secondary | ICD-10-CM | POA: Diagnosis not present

## 2021-01-31 DIAGNOSIS — M797 Fibromyalgia: Secondary | ICD-10-CM | POA: Diagnosis not present

## 2021-01-31 DIAGNOSIS — R079 Chest pain, unspecified: Secondary | ICD-10-CM | POA: Diagnosis not present

## 2021-01-31 DIAGNOSIS — M25569 Pain in unspecified knee: Secondary | ICD-10-CM | POA: Diagnosis not present

## 2021-01-31 DIAGNOSIS — M5416 Radiculopathy, lumbar region: Secondary | ICD-10-CM | POA: Diagnosis not present

## 2021-01-31 DIAGNOSIS — R5383 Other fatigue: Secondary | ICD-10-CM | POA: Diagnosis not present

## 2021-01-31 DIAGNOSIS — G43709 Chronic migraine without aura, not intractable, without status migrainosus: Secondary | ICD-10-CM | POA: Diagnosis not present

## 2021-01-31 DIAGNOSIS — M5010 Cervical disc disorder with radiculopathy, unspecified cervical region: Secondary | ICD-10-CM | POA: Diagnosis not present

## 2021-01-31 DIAGNOSIS — M545 Low back pain, unspecified: Secondary | ICD-10-CM | POA: Diagnosis not present

## 2021-01-31 DIAGNOSIS — Z79891 Long term (current) use of opiate analgesic: Secondary | ICD-10-CM | POA: Diagnosis not present

## 2021-01-31 DIAGNOSIS — E559 Vitamin D deficiency, unspecified: Secondary | ICD-10-CM | POA: Diagnosis not present

## 2021-01-31 DIAGNOSIS — G2581 Restless legs syndrome: Secondary | ICD-10-CM | POA: Diagnosis not present

## 2021-04-28 DIAGNOSIS — G43709 Chronic migraine without aura, not intractable, without status migrainosus: Secondary | ICD-10-CM | POA: Diagnosis not present

## 2021-04-28 DIAGNOSIS — G2581 Restless legs syndrome: Secondary | ICD-10-CM | POA: Diagnosis not present

## 2021-04-28 DIAGNOSIS — K219 Gastro-esophageal reflux disease without esophagitis: Secondary | ICD-10-CM | POA: Diagnosis not present

## 2021-04-28 DIAGNOSIS — M545 Low back pain, unspecified: Secondary | ICD-10-CM | POA: Diagnosis not present

## 2021-04-28 DIAGNOSIS — R5383 Other fatigue: Secondary | ICD-10-CM | POA: Diagnosis not present

## 2021-04-28 DIAGNOSIS — M5010 Cervical disc disorder with radiculopathy, unspecified cervical region: Secondary | ICD-10-CM | POA: Diagnosis not present

## 2021-04-28 DIAGNOSIS — M797 Fibromyalgia: Secondary | ICD-10-CM | POA: Diagnosis not present

## 2021-04-28 DIAGNOSIS — M5416 Radiculopathy, lumbar region: Secondary | ICD-10-CM | POA: Diagnosis not present

## 2021-04-28 DIAGNOSIS — F419 Anxiety disorder, unspecified: Secondary | ICD-10-CM | POA: Diagnosis not present

## 2021-04-28 DIAGNOSIS — Z79891 Long term (current) use of opiate analgesic: Secondary | ICD-10-CM | POA: Diagnosis not present

## 2021-04-28 DIAGNOSIS — M25569 Pain in unspecified knee: Secondary | ICD-10-CM | POA: Diagnosis not present

## 2021-04-28 DIAGNOSIS — G43701 Chronic migraine without aura, not intractable, with status migrainosus: Secondary | ICD-10-CM | POA: Diagnosis not present

## 2021-07-18 DIAGNOSIS — R000 Tachycardia, unspecified: Secondary | ICD-10-CM | POA: Diagnosis not present

## 2021-07-18 DIAGNOSIS — R42 Dizziness and giddiness: Secondary | ICD-10-CM | POA: Diagnosis not present

## 2021-07-18 DIAGNOSIS — G43909 Migraine, unspecified, not intractable, without status migrainosus: Secondary | ICD-10-CM | POA: Diagnosis not present

## 2021-07-19 ENCOUNTER — Other Ambulatory Visit: Payer: Self-pay | Admitting: Neurology

## 2021-07-19 ENCOUNTER — Other Ambulatory Visit (HOSPITAL_COMMUNITY): Payer: Self-pay | Admitting: Neurology

## 2021-07-19 DIAGNOSIS — M5010 Cervical disc disorder with radiculopathy, unspecified cervical region: Secondary | ICD-10-CM | POA: Diagnosis not present

## 2021-07-19 DIAGNOSIS — Z79891 Long term (current) use of opiate analgesic: Secondary | ICD-10-CM | POA: Diagnosis not present

## 2021-07-19 DIAGNOSIS — M545 Low back pain, unspecified: Secondary | ICD-10-CM | POA: Diagnosis not present

## 2021-07-19 DIAGNOSIS — E559 Vitamin D deficiency, unspecified: Secondary | ICD-10-CM | POA: Diagnosis not present

## 2021-07-19 DIAGNOSIS — G2581 Restless legs syndrome: Secondary | ICD-10-CM | POA: Diagnosis not present

## 2021-07-19 DIAGNOSIS — F419 Anxiety disorder, unspecified: Secondary | ICD-10-CM | POA: Diagnosis not present

## 2021-07-19 DIAGNOSIS — R569 Unspecified convulsions: Secondary | ICD-10-CM

## 2021-07-19 DIAGNOSIS — G43709 Chronic migraine without aura, not intractable, without status migrainosus: Secondary | ICD-10-CM | POA: Diagnosis not present

## 2021-07-19 DIAGNOSIS — M25569 Pain in unspecified knee: Secondary | ICD-10-CM | POA: Diagnosis not present

## 2021-07-19 DIAGNOSIS — M5416 Radiculopathy, lumbar region: Secondary | ICD-10-CM | POA: Diagnosis not present

## 2021-07-19 DIAGNOSIS — R5383 Other fatigue: Secondary | ICD-10-CM | POA: Diagnosis not present

## 2021-07-19 DIAGNOSIS — M797 Fibromyalgia: Secondary | ICD-10-CM | POA: Diagnosis not present

## 2021-07-20 DIAGNOSIS — E559 Vitamin D deficiency, unspecified: Secondary | ICD-10-CM | POA: Diagnosis not present

## 2021-07-20 DIAGNOSIS — Z79899 Other long term (current) drug therapy: Secondary | ICD-10-CM | POA: Diagnosis not present

## 2021-07-20 DIAGNOSIS — R7303 Prediabetes: Secondary | ICD-10-CM | POA: Diagnosis not present

## 2021-07-29 ENCOUNTER — Encounter (HOSPITAL_COMMUNITY): Payer: Self-pay

## 2021-07-29 ENCOUNTER — Ambulatory Visit (HOSPITAL_COMMUNITY): Payer: Medicaid Other

## 2021-08-16 DIAGNOSIS — M25569 Pain in unspecified knee: Secondary | ICD-10-CM | POA: Diagnosis not present

## 2021-08-16 DIAGNOSIS — R569 Unspecified convulsions: Secondary | ICD-10-CM | POA: Diagnosis not present

## 2021-08-16 DIAGNOSIS — R5383 Other fatigue: Secondary | ICD-10-CM | POA: Diagnosis not present

## 2021-08-16 DIAGNOSIS — Z79891 Long term (current) use of opiate analgesic: Secondary | ICD-10-CM | POA: Diagnosis not present

## 2021-08-16 DIAGNOSIS — R079 Chest pain, unspecified: Secondary | ICD-10-CM | POA: Diagnosis not present

## 2021-08-16 DIAGNOSIS — F331 Major depressive disorder, recurrent, moderate: Secondary | ICD-10-CM | POA: Diagnosis not present

## 2021-08-16 DIAGNOSIS — G2581 Restless legs syndrome: Secondary | ICD-10-CM | POA: Diagnosis not present

## 2021-08-16 DIAGNOSIS — M545 Low back pain, unspecified: Secondary | ICD-10-CM | POA: Diagnosis not present

## 2021-08-16 DIAGNOSIS — F419 Anxiety disorder, unspecified: Secondary | ICD-10-CM | POA: Diagnosis not present

## 2021-08-16 DIAGNOSIS — M5416 Radiculopathy, lumbar region: Secondary | ICD-10-CM | POA: Diagnosis not present

## 2021-08-16 DIAGNOSIS — M5010 Cervical disc disorder with radiculopathy, unspecified cervical region: Secondary | ICD-10-CM | POA: Diagnosis not present

## 2021-08-18 ENCOUNTER — Other Ambulatory Visit (HOSPITAL_COMMUNITY)
Admission: RE | Admit: 2021-08-18 | Discharge: 2021-08-18 | Disposition: A | Discharge status: Home / Self Care | Payer: Medicaid Other | Source: Ambulatory Visit | Attending: Obstetrics & Gynecology | Admitting: Obstetrics & Gynecology

## 2021-08-18 ENCOUNTER — Other Ambulatory Visit (INDEPENDENT_AMBULATORY_CARE_PROVIDER_SITE_OTHER): Payer: Medicaid Other | Admitting: *Deleted

## 2021-08-18 ENCOUNTER — Other Ambulatory Visit: Payer: Self-pay

## 2021-08-18 DIAGNOSIS — N898 Other specified noninflammatory disorders of vagina: Secondary | ICD-10-CM

## 2021-08-18 DIAGNOSIS — R300 Dysuria: Secondary | ICD-10-CM

## 2021-08-18 LAB — POCT URINALYSIS DIPSTICK
Blood, UA: NEGATIVE
Glucose, UA: NEGATIVE
Ketones, UA: NEGATIVE
Leukocytes, UA: NEGATIVE
Nitrite, UA: NEGATIVE
Protein, UA: NEGATIVE

## 2021-08-18 NOTE — Progress Notes (Signed)
Chart reviewed for nurse visit. Agree with plan of care.  Adline Potter, NP 08/18/2021 12:35 PM

## 2021-08-18 NOTE — Progress Notes (Signed)
   NURSE VISIT- VAGINITIS/STD/POC  SUBJECTIVE:  Kathryn Golden is a 45 y.o. [id number] GYN patientfemale here for a vaginal swab for vaginitis screening, STD screen.  She reports the following symptoms:  had vaginal discharge   just   1 time . Pt also reports burning with urination x 2 weeks. Urine dip was negative. I sent urine off for culture and u/a. Denies abnormal vaginal bleeding, significant pelvic pain, fever, or UTI symptoms.  OBJECTIVE:  LMP 07/23/2014   Appears well, in no apparent distress  ASSESSMENT: Vaginal swab for vaginitis screening & STD screen. Urine culture and u/a done.   PLAN: Self-collected vaginal probe for Gonorrhea, Chlamydia, Trichomonas, Bacterial Vaginosis, Yeast sent to lab. Urine culture and u/a sent to lab. Treatment: to be determined once results are received Follow-up as needed if symptoms persist/worsen, or new symptoms develop  Malachy Mood  08/18/2021 11:12 AM

## 2021-08-19 ENCOUNTER — Other Ambulatory Visit: Payer: Self-pay | Admitting: Adult Health

## 2021-08-19 LAB — CERVICOVAGINAL ANCILLARY ONLY
Bacterial Vaginitis (gardnerella): NEGATIVE
Candida Glabrata: NEGATIVE
Candida Vaginitis: POSITIVE — AB
Chlamydia: NEGATIVE
Comment: NEGATIVE
Comment: NEGATIVE
Comment: NEGATIVE
Comment: NEGATIVE
Comment: NEGATIVE
Comment: NORMAL
Neisseria Gonorrhea: NEGATIVE
Trichomonas: NEGATIVE

## 2021-08-19 LAB — URINALYSIS, ROUTINE W REFLEX MICROSCOPIC
Bilirubin, UA: NEGATIVE
Glucose, UA: NEGATIVE
Ketones, UA: NEGATIVE
Leukocytes,UA: NEGATIVE
Nitrite, UA: NEGATIVE
Protein,UA: NEGATIVE
RBC, UA: NEGATIVE
Specific Gravity, UA: 1.015 (ref 1.005–1.030)
Urobilinogen, Ur: 0.2 mg/dL (ref 0.2–1.0)
pH, UA: 6 (ref 5.0–7.5)

## 2021-08-19 MED ORDER — FLUCONAZOLE 150 MG PO TABS: ORAL_TABLET | ORAL | 1 refills | Status: DC

## 2021-08-19 NOTE — Progress Notes (Signed)
+  yeast on vaginal swab will rx diflucan  

## 2021-08-23 DIAGNOSIS — R569 Unspecified convulsions: Secondary | ICD-10-CM | POA: Diagnosis not present

## 2021-08-25 LAB — URINE CULTURE

## 2021-08-26 ENCOUNTER — Other Ambulatory Visit: Payer: Self-pay | Admitting: Adult Health

## 2021-08-26 MED ORDER — NITROFURANTOIN MONOHYD MACRO 100 MG PO CAPS: 100.0000 mg | ORAL_CAPSULE | Freq: Two times a day (BID) | ORAL | 0 refills | Status: DC

## 2021-08-26 NOTE — Progress Notes (Signed)
+   E coli on urine culture, will rx macrobid

## 2021-08-29 ENCOUNTER — Other Ambulatory Visit: Payer: Self-pay | Admitting: Adult Health

## 2021-08-31 ENCOUNTER — Other Ambulatory Visit: Payer: Self-pay

## 2021-08-31 ENCOUNTER — Ambulatory Visit (HOSPITAL_COMMUNITY)
Admission: RE | Admit: 2021-08-31 | Discharge: 2021-08-31 | Disposition: A | Discharge status: Home / Self Care | Payer: Medicaid Other | Source: Ambulatory Visit | Attending: Neurology | Admitting: Neurology

## 2021-08-31 DIAGNOSIS — R42 Dizziness and giddiness: Secondary | ICD-10-CM | POA: Diagnosis not present

## 2021-08-31 DIAGNOSIS — R410 Disorientation, unspecified: Secondary | ICD-10-CM | POA: Diagnosis not present

## 2021-08-31 DIAGNOSIS — R413 Other amnesia: Secondary | ICD-10-CM | POA: Diagnosis not present

## 2021-08-31 DIAGNOSIS — R569 Unspecified convulsions: Secondary | ICD-10-CM | POA: Insufficient documentation

## 2021-08-31 MED ORDER — GADOBUTROL 1 MMOL/ML IV SOLN
7.0000 mL | Freq: Once | INTRAVENOUS | Status: AC | PRN
  Administered 2021-08-31: 7 mL via INTRAVENOUS

## 2021-09-20 DIAGNOSIS — R5383 Other fatigue: Secondary | ICD-10-CM | POA: Diagnosis not present

## 2021-09-20 DIAGNOSIS — G2581 Restless legs syndrome: Secondary | ICD-10-CM | POA: Diagnosis not present

## 2021-09-20 DIAGNOSIS — R079 Chest pain, unspecified: Secondary | ICD-10-CM | POA: Diagnosis not present

## 2021-09-20 DIAGNOSIS — M5416 Radiculopathy, lumbar region: Secondary | ICD-10-CM | POA: Diagnosis not present

## 2021-09-20 DIAGNOSIS — F331 Major depressive disorder, recurrent, moderate: Secondary | ICD-10-CM | POA: Diagnosis not present

## 2021-09-20 DIAGNOSIS — G43709 Chronic migraine without aura, not intractable, without status migrainosus: Secondary | ICD-10-CM | POA: Diagnosis not present

## 2021-09-20 DIAGNOSIS — M545 Low back pain, unspecified: Secondary | ICD-10-CM | POA: Diagnosis not present

## 2021-09-20 DIAGNOSIS — R569 Unspecified convulsions: Secondary | ICD-10-CM | POA: Diagnosis not present

## 2021-09-20 DIAGNOSIS — M5010 Cervical disc disorder with radiculopathy, unspecified cervical region: Secondary | ICD-10-CM | POA: Diagnosis not present

## 2021-09-20 DIAGNOSIS — M25569 Pain in unspecified knee: Secondary | ICD-10-CM | POA: Diagnosis not present

## 2021-09-20 DIAGNOSIS — Z79891 Long term (current) use of opiate analgesic: Secondary | ICD-10-CM | POA: Diagnosis not present

## 2021-09-20 DIAGNOSIS — F419 Anxiety disorder, unspecified: Secondary | ICD-10-CM | POA: Diagnosis not present

## 2021-09-27 DIAGNOSIS — K219 Gastro-esophageal reflux disease without esophagitis: Secondary | ICD-10-CM | POA: Diagnosis not present

## 2021-09-27 DIAGNOSIS — G43909 Migraine, unspecified, not intractable, without status migrainosus: Secondary | ICD-10-CM | POA: Diagnosis not present

## 2021-09-27 DIAGNOSIS — F419 Anxiety disorder, unspecified: Secondary | ICD-10-CM | POA: Diagnosis not present

## 2021-09-27 DIAGNOSIS — E559 Vitamin D deficiency, unspecified: Secondary | ICD-10-CM | POA: Diagnosis not present

## 2021-09-27 DIAGNOSIS — E782 Mixed hyperlipidemia: Secondary | ICD-10-CM | POA: Diagnosis not present

## 2021-11-16 DIAGNOSIS — M25521 Pain in right elbow: Secondary | ICD-10-CM | POA: Diagnosis not present

## 2021-11-16 DIAGNOSIS — M545 Low back pain, unspecified: Secondary | ICD-10-CM | POA: Diagnosis not present

## 2021-11-16 DIAGNOSIS — G43709 Chronic migraine without aura, not intractable, without status migrainosus: Secondary | ICD-10-CM | POA: Diagnosis not present

## 2021-11-16 DIAGNOSIS — M25522 Pain in left elbow: Secondary | ICD-10-CM | POA: Diagnosis not present

## 2021-11-30 ENCOUNTER — Other Ambulatory Visit: Payer: Self-pay

## 2021-11-30 ENCOUNTER — Other Ambulatory Visit (HOSPITAL_COMMUNITY)
Admission: RE | Admit: 2021-11-30 | Discharge: 2021-11-30 | Disposition: A | Discharge status: Home / Self Care | Payer: Medicaid Other | Source: Ambulatory Visit | Attending: Advanced Practice Midwife | Admitting: Advanced Practice Midwife

## 2021-11-30 ENCOUNTER — Ambulatory Visit: Payer: Medicaid Other | Admitting: Advanced Practice Midwife

## 2021-11-30 ENCOUNTER — Encounter: Payer: Self-pay | Admitting: Advanced Practice Midwife

## 2021-11-30 VITALS — BP 123/77 | HR 103 | Ht 68.0 in | Wt 148.5 lb

## 2021-11-30 DIAGNOSIS — N898 Other specified noninflammatory disorders of vagina: Secondary | ICD-10-CM | POA: Diagnosis not present

## 2021-11-30 DIAGNOSIS — Z113 Encounter for screening for infections with a predominantly sexual mode of transmission: Secondary | ICD-10-CM | POA: Insufficient documentation

## 2021-11-30 DIAGNOSIS — R8290 Unspecified abnormal findings in urine: Secondary | ICD-10-CM | POA: Diagnosis not present

## 2021-11-30 DIAGNOSIS — M545 Low back pain, unspecified: Secondary | ICD-10-CM | POA: Diagnosis not present

## 2021-11-30 LAB — POCT URINALYSIS DIPSTICK
Blood, UA: NEGATIVE
Glucose, UA: NEGATIVE
Ketones, UA: NEGATIVE
Leukocytes, UA: NEGATIVE
Nitrite, UA: NEGATIVE
Protein, UA: NEGATIVE

## 2021-11-30 NOTE — Progress Notes (Addendum)
° °  GYN VISIT Patient name: Kathryn Golden MRN [id number]  Date of birth: [birth year] Chief Complaint:   low back pain (Urine has strong odor; vaginal discharge)  History of Present Illness:   MALEEA CAMILO is a 46 y.o. [id number] Caucasian female being seen today for eval of foul-smelling urine going on x 42mos-21yr. No dysuria. Drinks 'a lot' of soda, but has been trying to drink more water lately- approx 3 bottles/day. Reports low-mid back pain; no fever. Also with vag d/c.    Patient's last menstrual period was 07/23/2014. The current method of family planning is status post hysterectomy.  Last pap 2012. Results were:  negative  Depression screen [facility] 2/9 07/06/2017  Decreased Interest 3  Down, Depressed, Hopeless 3  PHQ - 2 Score 6  Altered sleeping 1  Tired, decreased energy 3  Change in appetite 2  Feeling bad or failure about yourself  0  Trouble concentrating 1  Moving slowly or fidgety/restless 0  Suicidal thoughts 0  PHQ-9 Score 13  Difficult doing work/chores Very difficult    No flowsheet data found.   Review of Systems:   Pertinent items are noted in HPI Denies fever/chills, dizziness, headaches, visual disturbances, fatigue, shortness of breath, chest pain, abdominal pain, vomiting, abnormal vaginal discharge/itching/odor/irritation, problems with periods, bowel movements, urination, or intercourse unless otherwise stated above.  Pertinent History Reviewed:  Reviewed past medical,surgical, social, obstetrical and family history.  Reviewed problem list, medications and allergies. Physical Assessment:   Vitals:   11/30/21 1357  BP: 123/77  Pulse: (!) 103  Weight: 148 lb 8 oz (67.4 kg)  Height: 5\' 8"  (1.727 m)  Body mass index is 22.58 kg/m.       Physical Examination:   General appearance: alert, well appearing, and in no distress  Mental status: alert, oriented to person, place, and time  Skin: warm & dry   Cardiovascular: normal heart rate noted  Respiratory:  normal respiratory effort, no distress  Abdomen: soft, non-tender   Pelvic: examination not indicated  Extremities: no edema    Results for orders placed or performed in visit on 11/30/21 (from the past 24 hour(s))  POCT Urinalysis Dipstick   Collection Time: 11/30/21  2:07 PM  Result Value Ref Range   Color, UA     Clarity, UA     Glucose, UA Negative Negative   Bilirubin, UA     Ketones, UA neg    Spec Grav, UA     Blood, UA neg    pH, UA     Protein, UA Negative Negative   Urobilinogen, UA     Nitrite, UA neg    Leukocytes, UA Negative Negative   Appearance     Odor      Assessment & Plan:  1) Foul-smelling urine> UA neg; recommend drink at least 64oz water qd; cut down on sodas  2) Vag d/c> CV swab sent- will f/u with results  Meds: No orders of the defined types were placed in this encounter.   Orders Placed This Encounter  Procedures   POCT Urinalysis Dipstick    Return for Pap & Physical, 1st available.(will need mammo orders also)  01/28/22 [facility] 11/30/2021 2:24 PM

## 2021-12-02 ENCOUNTER — Other Ambulatory Visit: Payer: Self-pay | Admitting: Advanced Practice Midwife

## 2021-12-02 LAB — CERVICOVAGINAL ANCILLARY ONLY
Bacterial Vaginitis (gardnerella): NEGATIVE
Candida Glabrata: NEGATIVE
Candida Vaginitis: POSITIVE — AB
Chlamydia: NEGATIVE
Comment: NEGATIVE
Comment: NEGATIVE
Comment: NEGATIVE
Comment: NEGATIVE
Comment: NEGATIVE
Comment: NORMAL
Neisseria Gonorrhea: NEGATIVE
Trichomonas: NEGATIVE

## 2021-12-02 MED ORDER — FLUCONAZOLE 150 MG PO TABS: 150.0000 mg | ORAL_TABLET | Freq: Once | ORAL | 0 refills | Status: DC

## 2021-12-15 ENCOUNTER — Other Ambulatory Visit: Payer: Self-pay | Admitting: Advanced Practice Midwife

## 2021-12-28 ENCOUNTER — Other Ambulatory Visit: Payer: Medicaid Other | Admitting: Obstetrics & Gynecology

## 2022-03-15 DIAGNOSIS — M5010 Cervical disc disorder with radiculopathy, unspecified cervical region: Secondary | ICD-10-CM | POA: Diagnosis not present

## 2022-03-15 DIAGNOSIS — Z79891 Long term (current) use of opiate analgesic: Secondary | ICD-10-CM | POA: Diagnosis not present

## 2022-03-15 DIAGNOSIS — G43701 Chronic migraine without aura, not intractable, with status migrainosus: Secondary | ICD-10-CM | POA: Diagnosis not present

## 2022-03-15 DIAGNOSIS — M545 Low back pain, unspecified: Secondary | ICD-10-CM | POA: Diagnosis not present

## 2022-04-10 DIAGNOSIS — H5213 Myopia, bilateral: Secondary | ICD-10-CM | POA: Diagnosis not present

## 2022-04-10 DIAGNOSIS — H5203 Hypermetropia, bilateral: Secondary | ICD-10-CM | POA: Diagnosis not present

## 2022-04-16 DIAGNOSIS — W57XXXA Bitten or stung by nonvenomous insect and other nonvenomous arthropods, initial encounter: Secondary | ICD-10-CM | POA: Diagnosis not present

## 2022-04-16 DIAGNOSIS — T63481A Toxic effect of venom of other arthropod, accidental (unintentional), initial encounter: Secondary | ICD-10-CM | POA: Diagnosis not present

## 2022-06-07 ENCOUNTER — Other Ambulatory Visit (HOSPITAL_COMMUNITY): Payer: Self-pay | Admitting: Neurology

## 2022-06-07 DIAGNOSIS — M5416 Radiculopathy, lumbar region: Secondary | ICD-10-CM

## 2022-11-28 ENCOUNTER — Other Ambulatory Visit (HOSPITAL_COMMUNITY): Payer: Self-pay | Admitting: Nurse Practitioner

## 2022-11-28 ENCOUNTER — Ambulatory Visit (HOSPITAL_COMMUNITY)
Admission: RE | Admit: 2022-11-28 | Discharge: 2022-11-28 | Disposition: A | Discharge status: Home / Self Care | Payer: Medicaid Other | Source: Ambulatory Visit | Attending: Nurse Practitioner | Admitting: Nurse Practitioner

## 2022-11-28 DIAGNOSIS — M5416 Radiculopathy, lumbar region: Secondary | ICD-10-CM | POA: Diagnosis present

## 2022-11-28 DIAGNOSIS — M47812 Spondylosis without myelopathy or radiculopathy, cervical region: Secondary | ICD-10-CM | POA: Insufficient documentation

## 2023-01-01 ENCOUNTER — Ambulatory Visit: Payer: Medicaid Other | Admitting: Obstetrics & Gynecology

## 2023-01-25 ENCOUNTER — Ambulatory Visit: Payer: Medicaid Other | Admitting: Obstetrics & Gynecology

## 2023-02-15 ENCOUNTER — Encounter: Payer: Self-pay | Admitting: Obstetrics & Gynecology

## 2023-02-15 ENCOUNTER — Ambulatory Visit (INDEPENDENT_AMBULATORY_CARE_PROVIDER_SITE_OTHER): Payer: Medicaid Other | Admitting: Obstetrics & Gynecology

## 2023-02-15 VITALS — BP 132/84 | HR 80 | Ht 68.0 in | Wt 159.0 lb

## 2023-02-15 DIAGNOSIS — N951 Menopausal and female climacteric states: Secondary | ICD-10-CM

## 2023-02-15 DIAGNOSIS — Z01419 Encounter for gynecological examination (general) (routine) without abnormal findings: Secondary | ICD-10-CM | POA: Diagnosis not present

## 2023-02-15 DIAGNOSIS — Z1231 Encounter for screening mammogram for malignant neoplasm of breast: Secondary | ICD-10-CM

## 2023-02-15 NOTE — Progress Notes (Signed)
Subjective:     Kathryn Golden is a 47 y.o. female here for a routine exam.  Patient's last menstrual period was 07/23/2014. [id number] Birth Control Method:  hysterectomy Menstrual Calendar(currently): amenorrheic  Current complaints: decreased libido.   Current acute medical issues:  none   Recent Gynecologic History Patient's last menstrual period was 07/23/2014. Last Pap: na-->hysterectomy,   Last mammogram: 2014-->ordered today,    Past Medical History:  Diagnosis Date   Abnormal Pap smear    ADHD (attention deficit hyperactivity disorder)    Anemia    Anxiety 03/27/2013   Breast lump 03/27/2013   Degenerative cervical disc    Depression    Diverticulosis    Dyspnea    WITH PAIN    Endometriosis    Fibromyalgia 03/27/2013   GERD (gastroesophageal reflux disease)    HPV (human papilloma virus) infection    Hx of degenerative disc disease    Migraine    PIH (pregnancy induced hypertension)    in pregnancy only   Sciatic nerve pain, right     Past Surgical History:  Procedure Laterality Date   ABDOMINAL HYSTERECTOMY     partial   BIOPSY  09/26/2017   Procedure: BIOPSY;  Surgeon: Corbin Ade, MD;  Location: [facility];  Service: Endoscopy;;  gastric   COLONOSCOPY WITH PROPOFOL N/A 09/26/2017   Procedure: COLONOSCOPY WITH PROPOFOL;  Surgeon: Corbin Ade, MD;  Location: [facility];  Service: Endoscopy;  Laterality: N/A;  9:30am   DILATION AND CURETTAGE OF UTERUS     dilation and curretage     ESOPHAGOGASTRODUODENOSCOPY (EGD) WITH PROPOFOL N/A 09/26/2017   Procedure: ESOPHAGOGASTRODUODENOSCOPY (EGD) WITH PROPOFOL;  Surgeon: Corbin Ade, MD;  Location: [facility];  Service: Endoscopy;  Laterality: N/A;   HAND SURGERY Right    tendon repair   LUMBAR LAMINECTOMY/DECOMPRESSION MICRODISCECTOMY Left 03/07/2018   Procedure: Microdiscectomy - Lumbar Four-Five left;  Surgeon: Tia Alert, MD;  Location: [facility];  Service: Neurosurgery;  Laterality: Left;  left    TUBAL LIGATION  07/24/2011   Procedure: POST PARTUM TUBAL LIGATION;  Surgeon: Catalina Antigua, MD;  Location: [facility];  Service: Gynecology;  Laterality: Bilateral;   VAGINAL HYSTERECTOMY N/A 09/02/2014   Procedure: HYSTERECTOMY VAGINAL;  Surgeon: Lazaro Arms, MD;  Location: [facility];  Service: Gynecology;  Laterality: N/A;    OB History     Gravida  5   Para  3   Term  3   Preterm  0   AB  2   Living  3      SAB  2   IAB  0   Ectopic  0   Multiple  0   Live Births  1           Social History   Socioeconomic History   Marital status: Divorced    Spouse name: Not on file   Number of children: 3   Years of education: Not on file   Highest education level: Not on file  Occupational History   Not on file  Tobacco Use   Smoking status: Former    Packs/day: 1.00    Years: 20.00    Additional pack years: 0.00    Total pack years: 20.00    Types: Cigarettes   Smokeless tobacco: Never  Vaping Use   Vaping Use: Every day  Substance and Sexual Activity   Alcohol use: No   Drug use: No   Sexual activity: Yes  Birth control/protection: Surgical    Comment: hyst  Other Topics Concern   Not on file  Social History Narrative   Not on file   Social Determinants of Health   Financial Resource Strain: Medium Risk (02/15/2023)   Overall Financial Resource Strain (CARDIA)    Difficulty of Paying Living Expenses: Somewhat hard  Food Insecurity: Food Insecurity Present (02/15/2023)   Hunger Vital Sign    Worried About Running Out of Food in the Last Year: Sometimes true    Ran Out of Food in the Last Year: Sometimes true  Transportation Needs: No Transportation Needs (02/15/2023)   PRAPARE - Administrator, Civil Service (Medical): No    Lack of Transportation (Non-Medical): No  Physical Activity: Insufficiently Active (02/15/2023)   Exercise Vital Sign    Days of Exercise per Week: 6 days    Minutes of Exercise per Session: 20 min  Stress: Patient  Declined (02/15/2023)   [employer] of Occupational Health - Occupational Stress Questionnaire    Feeling of Stress : Patient declined  Social Connections: Socially Isolated (02/15/2023)   Social Connection and Isolation Panel [NHANES]    Frequency of Communication with Friends and Family: Once a week    Frequency of Social Gatherings with Friends and Family: Once a week    Attends Religious Services: 1 to 4 times per year    Active Member of [employer] or Organizations: No    Attends Banker Meetings: Never    Marital Status: Divorced    Family History  Problem Relation Age of Onset   Cancer Father        kidney    Hypertension Father    Heart disease Father    Hyperlipidemia Father    Other Brother        Factor 13 def.   Clotting disorder Brother    Hyperlipidemia Paternal Grandfather    Heart disease Paternal Grandfather    Ulcers Brother    Hypertension Other    Inflammatory bowel disease Neg Hx    Celiac disease Neg Hx    Colon cancer Neg Hx      Current Outpatient Medications:    AIMOVIG 140 MG/ML SOAJ, Inject into the skin every 30 (thirty) days., Disp: , Rfl:    butalbital-acetaminophen-caffeine (FIORICET, ESGIC) 50-325-40 MG tablet, Take 1 tablet by mouth daily as needed for headache or migraine., Disp: , Rfl:    clonazePAM (KLONOPIN) 1 MG tablet, Take 1 mg by mouth 2 (two) times daily as needed for anxiety. , Disp: , Rfl:    DULoxetine (CYMBALTA) 20 MG capsule, Take 20 mg by mouth daily., Disp: , Rfl:    HYDROcodone-acetaminophen (NORCO) 10-325 MG per tablet, Take 1 tablet by mouth every 6 (six) hours as needed for moderate pain. , Disp: , Rfl:    metoprolol tartrate (LOPRESSOR) 50 MG tablet, Take 50 mg by mouth 2 (two) times daily., Disp: , Rfl:    pravastatin (PRAVACHOL) 20 MG tablet, Take 20 mg by mouth daily., Disp: , Rfl:    pregabalin (LYRICA) 200 MG capsule, Take 200 mg by mouth at bedtime., Disp: , Rfl:    RABEprazole (ACIPHEX) 20 MG tablet,  Take 1 tablet (20 mg total) by mouth daily., Disp: 30 tablet, Rfl: 11   rizatriptan (MAXALT) 10 MG tablet, Take 10 mg by mouth as needed for migraine. May repeat in 2 hours if needed, Disp: , Rfl:    tiZANidine (ZANAFLEX) 2 MG tablet, Take 2-4 mg by  mouth every 8 (eight) hours as needed., Disp: , Rfl:    UBRELVY 100 MG TABS, Take 1 tablet by mouth daily as needed., Disp: , Rfl:    vitamin C (ASCORBIC ACID) 500 MG tablet, Take 500 mg by mouth daily., Disp: , Rfl:    Vitamin D, Ergocalciferol, (DRISDOL) 50000 units CAPS capsule, Take 50,000 Units by mouth every Monday. , Disp: , Rfl:    celecoxib (CELEBREX) 100 MG capsule, Take 100 mg by mouth daily as needed for moderate pain.  (Patient not taking: Reported on 02/15/2023), Disp: , Rfl:    Cyanocobalamin (B-12) 2500 MCG TABS, Take 2,500 mcg by mouth. (Patient not taking: Reported on 02/15/2023), Disp: , Rfl:    Diclofenac Potassium 25 MG CAPS, Zipsor 25 mg capsule  TAKE (1) CAPSULE BY MOUTH EVERY SIX HOURS AS NEEDED. (Patient not taking: Reported on 02/15/2023), Disp: , Rfl:    fluconazole (DIFLUCAN) 150 MG tablet, TAKE 1 TABLET BY MOUTH FOR 1 DOSE. (Patient not taking: Reported on 02/15/2023), Disp: 1 tablet, Rfl: 0   methocarbamol (ROBAXIN) 500 MG tablet, Take 500 mg by mouth daily as needed for muscle spasms. (Patient not taking: Reported on 02/15/2023), Disp: , Rfl:   Review of Systems  Review of Systems  Constitutional: Negative for fever, chills, weight loss, malaise/fatigue and diaphoresis.  HENT: Negative for hearing loss, ear pain, nosebleeds, congestion, sore throat, neck pain, tinnitus and ear discharge.   Eyes: Negative for blurred vision, double vision, photophobia, pain, discharge and redness.  Respiratory: Negative for cough, hemoptysis, sputum production, shortness of breath, wheezing and stridor.   Cardiovascular: Negative for chest pain, palpitations, orthopnea, claudication, leg swelling and PND.  Gastrointestinal: negative for  abdominal pain. Negative for heartburn, nausea, vomiting, diarrhea, constipation, blood in stool and melena.  Genitourinary: Negative for dysuria, urgency, frequency, hematuria and flank pain.  Musculoskeletal: Negative for myalgias, back pain, joint pain and falls.  Skin: Negative for itching and rash.  Neurological: Negative for dizziness, tingling, tremors, sensory change, speech change, focal weakness, seizures, loss of consciousness, weakness and headaches.  Endo/Heme/Allergies: Negative for environmental allergies and polydipsia. Does not bruise/bleed easily.  Psychiatric/Behavioral: Negative for depression, suicidal ideas, hallucinations, memory loss and substance abuse. The patient is not nervous/anxious and does not have insomnia.        Objective:  Blood pressure 132/84, pulse 80, height 5\' 8"  (1.727 m), weight 159 lb (72.1 kg), last menstrual period 07/23/2014.   Physical Exam  Vitals reviewed. Constitutional: She is oriented to person, place, and time. She appears well-developed and well-nourished.  HENT:  Head: Normocephalic and atraumatic.        Right Ear: External ear normal.  Left Ear: External ear normal.  Nose: Nose normal.  Mouth/Throat: Oropharynx is clear and moist.  Eyes: Conjunctivae and EOM are normal. Pupils are equal, round, and reactive to light. Right eye exhibits no discharge. Left eye exhibits no discharge. No scleral icterus.  Neck: Normal range of motion. Neck supple. No tracheal deviation present. No thyromegaly present.  Cardiovascular: Normal rate, regular rhythm, normal heart sounds and intact distal pulses.  Exam reveals no gallop and no friction rub.   No murmur heard. Respiratory: Effort normal and breath sounds normal. No respiratory distress. She has no wheezes. She has no rales. She exhibits no tenderness.  GI: Soft. Bowel sounds are normal. She exhibits no distension and no mass. There is no tenderness. There is no rebound and no guarding.   Genitourinary:  Breasts no masses skin changes  or nipple changes bilaterally      Vulva is normal without lesions Vagina is pink moist without discharge Cervix absent and pap is not done Uterus is absent Adnexa is negative with normal sized ovaries   Musculoskeletal: Normal range of motion. She exhibits no edema and no tenderness.  Neurological: She is alert and oriented to person, place, and time. She has normal reflexes. She displays normal reflexes. No cranial nerve deficit. She exhibits normal muscle tone. Coordination normal.  Skin: Skin is warm and dry. No rash noted. No erythema. No pallor.  Psychiatric: She has a normal mood and affect. Her behavior is normal. Judgment and thought content normal.       Medications Ordered at today's visit: No orders of the defined types were placed in this encounter.   Other orders placed at today's visit: Orders Placed This Encounter  Procedures   MM 3D SCREENING MAMMOGRAM BILATERAL BREAST   [facility]      Assessment:    Normal Gyn exam.   Decreased libido, some vasomotor symptoms Plan:    Contraception: status post hysterectomy. Mammogram ordered. Follow up in: 3 years.     Return in about 3 years (around 02/14/2026) for yearly.

## 2023-02-16 LAB — FOLLICLE STIMULATING HORMONE: FSH: 5.7 m[IU]/mL

## 2023-08-27 ENCOUNTER — Ambulatory Visit
Admission: EM | Admit: 2023-08-27 | Discharge: 2023-08-27 | Disposition: A | Discharge status: Home / Self Care | Payer: Medicaid Other | Attending: Nurse Practitioner | Admitting: Nurse Practitioner

## 2023-08-27 DIAGNOSIS — J069 Acute upper respiratory infection, unspecified: Secondary | ICD-10-CM | POA: Diagnosis present

## 2023-08-27 DIAGNOSIS — Z1152 Encounter for screening for COVID-19: Secondary | ICD-10-CM | POA: Insufficient documentation

## 2023-08-27 LAB — POCT RAPID STREP A (OFFICE): Rapid Strep A Screen: NEGATIVE

## 2023-08-27 MED ORDER — LIDOCAINE VISCOUS HCL 2 % MT SOLN: OROMUCOSAL | 0 refills | Status: DC

## 2023-08-27 MED ORDER — CETIRIZINE HCL 10 MG PO TABS: 10.0000 mg | ORAL_TABLET | Freq: Every day | ORAL | 0 refills | Status: AC

## 2023-08-27 MED ORDER — PSEUDOEPH-BROMPHEN-DM 30-2-10 MG/5ML PO SYRP: 5.0000 mL | ORAL_SOLUTION | Freq: Four times a day (QID) | ORAL | 0 refills | Status: DC | PRN

## 2023-08-27 MED ORDER — FLUTICASONE PROPIONATE 50 MCG/ACT NA SUSP: 2.0000 | Freq: Every day | NASAL | 0 refills | Status: DC

## 2023-08-27 NOTE — Discharge Instructions (Addendum)
The rapid strep test was negative.  A throat culture and COVID test are pending.  You will be contacted if the pending test results are positive.  You will also have access to the results via MyChart. Take medication as prescribed. Increase fluids and allow for plenty of rest. May take over-the-counter Tylenol or ibuprofen as needed for pain, fever, or general discomfort. May use normal saline nasal spray throughout the day for nasal congestion and runny nose. For the cough, recommend using humidifier in your bedroom at nighttime during sleep and sleeping elevated on pillows while cough symptoms persist. Warm salt water gargles 3-4 times daily as needed for throat pain or discomfort. Also recommend a soft diet to include soup, broth, yogurt, pudding, Jell-O, warm tea with honey or lemon, or popsicles while throat pain persist. May use Chloraseptic throat spray and throat lozenges as needed for throat pain. If symptoms do not improve over the next 7 to 10 days, or if they suddenly worsen, you may follow-up in this clinic or with your primary care physician for further evaluation. Follow-up as needed.

## 2023-08-27 NOTE — ED Triage Notes (Signed)
Sore throat, congestion, cough x 2 days. Taking otc allergy medication with no relief of symptoms.

## 2023-08-27 NOTE — ED Provider Notes (Signed)
[facility]    CSN: [id number] Arrival date & time: 08/27/23  1157      History   Chief Complaint Chief Complaint  Patient presents with   Sore Throat   Cough    HPI Kathryn Golden is a 47 y.o. female.   The history is provided by the patient.   Patient presents for complaints of sore throat, nasal congestion, and cough.  Symptoms have her present for the past 2 days.  Patient denies fever, chills, headache, ear pain, wheezing, chest pain, abdominal pain, nausea, vomiting, diarrhea, or rash.  Patient reports she has been taking Claritin for her symptoms with no relief.  Patient's daughter is sick with the same or similar symptoms.  Past Medical History:  Diagnosis Date   Abnormal Pap smear    ADHD (attention deficit hyperactivity disorder)    Anemia    Anxiety 03/27/2013   Breast lump 03/27/2013   Degenerative cervical disc    Depression    Diverticulosis    Dyspnea    WITH PAIN    Endometriosis    Fibromyalgia 03/27/2013   GERD (gastroesophageal reflux disease)    HPV (human papilloma virus) infection    Hx of degenerative disc disease    Migraine    PIH (pregnancy induced hypertension)    in pregnancy only   Sciatic nerve pain, right     Patient Active Problem List   Diagnosis Date Noted   S/P lumbar laminectomy 03/07/2018   Abdominal pain 08/17/2017   N&V (nausea and vomiting) 08/17/2017   Chronic diarrhea 08/17/2017   S/P vaginal hysterectomy 09/02/2014   Endometriosis 08/05/2014   Pelvic pain in female 08/05/2014   Menorrhagia with regular cycle 07/29/2014   Fibromyalgia 03/27/2013   Anxiety 03/27/2013   Breast lump 03/27/2013    Past Surgical History:  Procedure Laterality Date   ABDOMINAL HYSTERECTOMY     partial   BIOPSY  09/26/2017   Procedure: BIOPSY;  Surgeon: Corbin Ade, MD;  Location: [facility];  Service: Endoscopy;;  gastric   COLONOSCOPY WITH PROPOFOL N/A 09/26/2017   Procedure: COLONOSCOPY WITH PROPOFOL;  Surgeon: Corbin Ade, MD;  Location: [facility];  Service: Endoscopy;  Laterality: N/A;  9:30am   DILATION AND CURETTAGE OF UTERUS     dilation and curretage     ESOPHAGOGASTRODUODENOSCOPY (EGD) WITH PROPOFOL N/A 09/26/2017   Procedure: ESOPHAGOGASTRODUODENOSCOPY (EGD) WITH PROPOFOL;  Surgeon: Corbin Ade, MD;  Location: [facility];  Service: Endoscopy;  Laterality: N/A;   HAND SURGERY Right    tendon repair   LUMBAR LAMINECTOMY/DECOMPRESSION MICRODISCECTOMY Left 03/07/2018   Procedure: Microdiscectomy - Lumbar Four-Five left;  Surgeon: Tia Alert, MD;  Location: [facility] OR;  Service: Neurosurgery;  Laterality: Left;  left   TUBAL LIGATION  07/24/2011   Procedure: POST PARTUM TUBAL LIGATION;  Surgeon: Catalina Antigua, MD;  Location: [facility];  Service: Gynecology;  Laterality: Bilateral;   VAGINAL HYSTERECTOMY N/A 09/02/2014   Procedure: HYSTERECTOMY VAGINAL;  Surgeon: Lazaro Arms, MD;  Location: [facility];  Service: Gynecology;  Laterality: N/A;    OB History     Gravida  5   Para  3   Term  3   Preterm  0   AB  2   Living  3      SAB  2   IAB  0   Ectopic  0   Multiple  0   Live Births  1  Home Medications    Prior to Admission medications   Medication Sig Start Date End Date Taking? Authorizing Provider  AIMOVIG 140 MG/ML SOAJ Inject into the skin every 30 (thirty) days. 08/16/21  Yes [provider]  brompheniramine-pseudoephedrine-DM 30-2-10 MG/5ML syrup Take 5 mLs by mouth 4 (four) times daily as needed. 08/27/23  Yes Leath-Warren, Sadie Haber, NP  butalbital-acetaminophen-caffeine (FIORICET, ESGIC) 50-325-40 MG tablet Take 1 tablet by mouth daily as needed for headache or migraine.   Yes [provider]  cetirizine (ZYRTEC) 10 MG tablet Take 1 tablet (10 mg total) by mouth daily. 08/27/23  Yes Leath-Warren, Sadie Haber, NP  clonazePAM (KLONOPIN) 1 MG tablet Take 1 mg by mouth 2 (two) times daily as needed for anxiety.    Yes [provider]  fluticasone (FLONASE) 50 MCG/ACT nasal spray Place 2 sprays into both nostrils daily. 08/27/23  Yes Leath-Warren, Sadie Haber, NP  HYDROcodone-acetaminophen (NORCO) 10-325 MG per tablet Take 1 tablet by mouth every 6 (six) hours as needed for moderate pain.    Yes [provider]  lidocaine (XYLOCAINE) 2 % solution Gargle and spit 5 mL every 6 hours as needed for throat pain or discomfort. 08/27/23  Yes Leath-Warren, Sadie Haber, NP  metoprolol tartrate (LOPRESSOR) 50 MG tablet Take 50 mg by mouth 2 (two) times daily. 08/16/21  Yes [provider]  pravastatin (PRAVACHOL) 20 MG tablet Take 20 mg by mouth daily.   Yes [provider]  pregabalin (LYRICA) 200 MG capsule Take 200 mg by mouth at bedtime.   Yes [provider]  RABEprazole (ACIPHEX) 20 MG tablet Take 1 tablet (20 mg total) by mouth daily. 12/21/17  Yes Rourk, Gerrit Friends, MD  rizatriptan (MAXALT) 10 MG tablet Take 10 mg by mouth as needed for migraine. May repeat in 2 hours if needed   Yes [provider]  tiZANidine (ZANAFLEX) 2 MG tablet Take 2-4 mg by mouth every 8 (eight) hours as needed.   Yes [provider]  UBRELVY 100 MG TABS Take 1 tablet by mouth daily as needed. 11/16/21  Yes [provider]  Vitamin D, Ergocalciferol, (DRISDOL) 50000 units CAPS capsule Take 50,000 Units by mouth every Monday.    Yes [provider]  celecoxib (CELEBREX) 100 MG capsule Take 100 mg by mouth daily as needed for moderate pain.  Patient not taking: Reported on 02/15/2023    [provider]  Cyanocobalamin (B-12) 2500 MCG TABS Take 2,500 mcg by mouth. Patient not taking: Reported on 02/15/2023    [provider]  Diclofenac Potassium 25 MG CAPS Zipsor 25 mg capsule  TAKE (1) CAPSULE BY MOUTH EVERY SIX HOURS AS NEEDED. Patient not taking: Reported on 02/15/2023    [provider]  DULoxetine (CYMBALTA) 20 MG capsule Take 20 mg by mouth daily.     [provider]  fluconazole (DIFLUCAN) 150 MG tablet TAKE 1 TABLET BY MOUTH FOR 1 DOSE. Patient not taking: Reported on 02/15/2023 12/16/21   [name], CNM  methocarbamol (ROBAXIN) 500 MG tablet Take 500 mg by mouth daily as needed for muscle spasms. Patient not taking: Reported on 02/15/2023    [provider]  vitamin C (ASCORBIC ACID) 500 MG tablet Take 500 mg by mouth daily.    [provider]    Family History Family History  Problem Relation Age of Onset   Cancer Father        kidney    Hypertension Father  Heart disease Father    Hyperlipidemia Father    Other Brother        Factor 13 def.   Clotting disorder Brother    Hyperlipidemia Paternal Grandfather    Heart disease Paternal Grandfather    Ulcers Brother    Hypertension Other    Inflammatory bowel disease Neg Hx    Celiac disease Neg Hx    Colon cancer Neg Hx     Social History Social History   Tobacco Use   Smoking status: Former    Current packs/day: 1.00    Average packs/day: 1 pack/day for 20.0 years (20.0 ttl pk-yrs)    Types: Cigarettes   Smokeless tobacco: Never  Vaping Use   Vaping status: Every Day  Substance Use Topics   Alcohol use: No   Drug use: No     Allergies   Percocet [oxycodone-acetaminophen]   Review of Systems Review of Systems Per HPI  Physical Exam Triage Vital Signs ED Triage Vitals  Encounter Vitals Group     BP 08/27/23 1316 133/79     Systolic BP Percentile --      Diastolic BP Percentile --      Pulse Rate 08/27/23 1316 88     Resp 08/27/23 1316 16     Temp 08/27/23 1316 98 F (36.7 C)     Temp Source 08/27/23 1316 Oral     SpO2 08/27/23 1316 97 %     Weight --      Height --      Head Circumference --      Peak Flow --      Pain Score 08/27/23 1318 10     Pain Loc --      Pain Education --      Exclude from Growth Chart --    No data found.  Updated Vital Signs BP 133/79 (BP Location: Right Arm)   Pulse 88    Temp 98 F (36.7 C) (Oral)   Resp 16   LMP 07/23/2014   SpO2 97%   Visual Acuity Right Eye Distance:   Left Eye Distance:   Bilateral Distance:    Right Eye Near:   Left Eye Near:    Bilateral Near:     Physical Exam Vitals and nursing note reviewed.  Constitutional:      General: She is not in acute distress.    Appearance: She is well-developed.  HENT:     Head: Normocephalic.     Right Ear: Tympanic membrane and ear canal normal.     Left Ear: Tympanic membrane and ear canal normal.     Nose: Congestion present.     Right Turbinates: Enlarged and swollen.     Left Turbinates: Enlarged and swollen.     Right Sinus: No maxillary sinus tenderness or frontal sinus tenderness.     Left Sinus: No maxillary sinus tenderness or frontal sinus tenderness.     Mouth/Throat:     Lips: Pink.     Mouth: Mucous membranes are moist.     Pharynx: Pharyngeal swelling and posterior oropharyngeal erythema present. No oropharyngeal exudate or uvula swelling.     Tonsils: 1+ on the right. 1+ on the left.     Comments: Cobblestoning present to posterior oropharynx  Eyes:     Extraocular Movements: Extraocular movements intact.     Conjunctiva/sclera: Conjunctivae normal.     Pupils: Pupils are equal, round, and reactive to light.  Cardiovascular:     Rate and  Rhythm: Normal rate and regular rhythm.     Pulses: Normal pulses.     Heart sounds: Normal heart sounds.  Pulmonary:     Effort: Pulmonary effort is normal. No respiratory distress.     Breath sounds: Normal breath sounds. No stridor. No wheezing, rhonchi or rales.  Abdominal:     General: Bowel sounds are normal.     Palpations: Abdomen is soft.     Tenderness: There is no abdominal tenderness.  Musculoskeletal:     Cervical back: Normal range of motion.  Lymphadenopathy:     Cervical: No cervical adenopathy.  Skin:    General: Skin is warm and dry.  Neurological:     General: No focal deficit present.     Mental Status:  She is alert and oriented to person, place, and time.  Psychiatric:        Mood and Affect: Mood normal.        Behavior: Behavior normal.      UC Treatments / Results  Labs (all labs ordered are listed, but only abnormal results are displayed) Labs Reviewed  SARS CORONAVIRUS 2 (TAT 6-24 HRS)  CULTURE, GROUP A STREP [facility])  POCT RAPID STREP A (OFFICE)    EKG   Radiology No results found.  Procedures Procedures (including critical care time)  Medications Ordered in UC Medications - No data to display  Initial Impression / Assessment and Plan / UC Course  I have reviewed the triage vital signs and the nursing notes.  Pertinent labs & imaging results that were available during my care of the patient were reviewed by me and considered in my medical decision making (see chart for details).  Rapid strep test was negative, throat culture and COVID test are pending.  Patient and patient's symptoms, suspect a viral upper respiratory infection with cough.  Will provide symptomatic treatment with Bromfed-DM for the cough, cetirizine 10 mg for postnasal drainage and nasal congestion, will take some 50 mcg nasal spray for nasal congestion, and viscous lidocaine 2% for patient to gargle and spit for throat pain.  Supportive care recommendations were provided and discussed with the patient to include warm salt water gargles, over-the-counter analgesics, a soft diet, and use of Chloraseptic throat spray and throat lozenges.  Patient was given indications of when follow-up be necessary.  Patient is in agreement with this plan of care and verbalizes understanding.  All questions were answered.  Patient stable for discharge.  Work note was provided.  Final Clinical Impressions(s) / UC Diagnoses   Final diagnoses:  Viral upper respiratory tract infection with cough  Encounter for screening for COVID-19     Discharge Instructions      The rapid strep test was negative.  A throat culture and  COVID test are pending.  You will be contacted if the pending test results are positive.  You will also have access to the results via MyChart. Take medication as prescribed. Increase fluids and allow for plenty of rest. May take over-the-counter Tylenol or ibuprofen as needed for pain, fever, or general discomfort. May use normal saline nasal spray throughout the day for nasal congestion and runny nose. For the cough, recommend using humidifier in your bedroom at nighttime during sleep and sleeping elevated on pillows while cough symptoms persist. Warm salt water gargles 3-4 times daily as needed for throat pain or discomfort. Also recommend a soft diet to include soup, broth, yogurt, pudding, Jell-O, warm tea with honey or lemon, or popsicles while throat  pain persist. May use Chloraseptic throat spray and throat lozenges as needed for throat pain. If symptoms do not improve over the next 7 to 10 days, or if they suddenly worsen, you may follow-up in this clinic or with your primary care physician for further evaluation. Follow-up as needed.     ED Prescriptions     Medication Sig Dispense Auth. Provider   brompheniramine-pseudoephedrine-DM 30-2-10 MG/5ML syrup Take 5 mLs by mouth 4 (four) times daily as needed. 140 mL Leath-Warren, Sadie Haber, NP   fluticasone (FLONASE) 50 MCG/ACT nasal spray Place 2 sprays into both nostrils daily. 16 g Leath-Warren, Sadie Haber, NP   cetirizine (ZYRTEC) 10 MG tablet Take 1 tablet (10 mg total) by mouth daily. 30 tablet Leath-Warren, Sadie Haber, NP   lidocaine (XYLOCAINE) 2 % solution Gargle and spit 5 mL every 6 hours as needed for throat pain or discomfort. 100 mL Leath-Warren, Sadie Haber, NP      PDMP not reviewed this encounter.   Abran Cantor, NP 08/27/23 1353

## 2023-08-28 LAB — SARS CORONAVIRUS 2 (TAT 6-24 HRS): SARS Coronavirus 2: NEGATIVE

## 2023-08-30 LAB — CULTURE, GROUP A STREP (THRC)

## 2023-12-08 ENCOUNTER — Encounter (HOSPITAL_COMMUNITY): Payer: Self-pay

## 2023-12-08 ENCOUNTER — Emergency Department (HOSPITAL_COMMUNITY)
Admission: EM | Admit: 2023-12-08 | Discharge: 2023-12-08 | Disposition: A | Discharge status: Home / Self Care | Payer: Medicaid Other

## 2023-12-08 ENCOUNTER — Other Ambulatory Visit: Payer: Self-pay

## 2023-12-08 ENCOUNTER — Emergency Department (HOSPITAL_COMMUNITY): Payer: Medicaid Other

## 2023-12-08 DIAGNOSIS — R300 Dysuria: Secondary | ICD-10-CM | POA: Diagnosis not present

## 2023-12-08 DIAGNOSIS — Z79899 Other long term (current) drug therapy: Secondary | ICD-10-CM | POA: Insufficient documentation

## 2023-12-08 DIAGNOSIS — R7989 Other specified abnormal findings of blood chemistry: Secondary | ICD-10-CM | POA: Insufficient documentation

## 2023-12-08 DIAGNOSIS — M545 Low back pain, unspecified: Secondary | ICD-10-CM | POA: Diagnosis present

## 2023-12-08 LAB — URINALYSIS, ROUTINE W REFLEX MICROSCOPIC
Bilirubin Urine: NEGATIVE
Glucose, UA: NEGATIVE mg/dL
Hgb urine dipstick: NEGATIVE
Ketones, ur: NEGATIVE mg/dL
Leukocytes,Ua: NEGATIVE
Nitrite: NEGATIVE
Protein, ur: NEGATIVE mg/dL
Specific Gravity, Urine: 1.004 — ABNORMAL LOW (ref 1.005–1.030)
pH: 5 (ref 5.0–8.0)

## 2023-12-08 LAB — CBC
HCT: 43.2 % (ref 36.0–46.0)
Hemoglobin: 14.2 g/dL (ref 12.0–15.0)
MCH: 29.6 pg (ref 26.0–34.0)
MCHC: 32.9 g/dL (ref 30.0–36.0)
MCV: 90 fL (ref 80.0–100.0)
Platelets: 248 10*3/uL (ref 150–400)
RBC: 4.8 MIL/uL (ref 3.87–5.11)
RDW: 12.5 % (ref 11.5–15.5)
WBC: 6.2 10*3/uL (ref 4.0–10.5)
nRBC: 0 % (ref 0.0–0.2)

## 2023-12-08 LAB — COMPREHENSIVE METABOLIC PANEL
ALT: 15 U/L (ref 0–44)
AST: 22 U/L (ref 15–41)
Albumin: 4.4 g/dL (ref 3.5–5.0)
Alkaline Phosphatase: 70 U/L (ref 38–126)
Anion gap: 9 (ref 5–15)
BUN: 9 mg/dL (ref 6–20)
CO2: 23 mmol/L (ref 22–32)
Calcium: 9.7 mg/dL (ref 8.9–10.3)
Chloride: 104 mmol/L (ref 98–111)
Creatinine, Ser: 0.56 mg/dL (ref 0.44–1.00)
GFR, Estimated: >60 mL/min (ref >60)
Glucose, Bld: 111 mg/dL — ABNORMAL HIGH (ref 70–99)
Potassium: 3.8 mmol/L (ref 3.5–5.1)
Sodium: 136 mmol/L (ref 135–145)
Total Bilirubin: 0.7 mg/dL (ref 0.0–1.2)
Total Protein: 8.1 g/dL (ref 6.5–8.1)

## 2023-12-08 MED ORDER — KETOROLAC TROMETHAMINE 15 MG/ML IJ SOLN
15.0000 mg | Freq: Once | INTRAMUSCULAR | Status: AC
  Administered 2023-12-08: 15 mg via INTRAVENOUS
  Filled 2023-12-08: qty 1

## 2023-12-08 NOTE — ED Provider Notes (Signed)
[place] EMERGENCY DEPARTMENT AT [facility] Provider Note   CSN: [id number] Arrival date & time: 12/08/23  1704     History  Chief Complaint  Patient presents with   Back Pain    Kathryn Golden is a 48 y.o. female with medical history of fibromyalgia, ADHD, anemia, diverticulosis, dyspnea, endometriosis, GERD, HPV, DDD, sciatic nerve pain on the right, lumbar laminectomy/decompression with microdiscectomy in 2019, chronic back pain.  The patient presents to the ED for evaluation of right-sided low back pain.  She reports that ever since Tuesday she has had pain located in her low back primarily to the right that she reports is different from her typical pain.  She reports that she has had chronic back pain for quite some time and takes 10 mg hydrocodone's every 6 hours for this pain.  She reports ever since Tuesday the pain has been more nagging, not responsive to her typical pain medication.  Denies any lifting, bending or twisting or event to account for this increase in pain.  Denies bowel or bladder incontinence, groin numbness, distal weakness.  She is concerned about infection but denies any fevers at home.  Denies nausea, vomiting, diarrhea.  Reports of last bowel movement was today.  She is hesitant to endorse dysuria but states that she has had some slight burning with urination recently.  Denies a history of kidney stones.  Denies chest pain or shortness of breath.   Back Pain Associated symptoms: no abdominal pain, no fever, no numbness and no weakness        Home Medications Prior to Admission medications   Medication Sig Start Date End Date Taking? Authorizing Provider  AIMOVIG 140 MG/ML SOAJ Inject into the skin every 30 (thirty) days. 08/16/21   [provider]  brompheniramine-pseudoephedrine-DM 30-2-10 MG/5ML syrup Take 5 mLs by mouth 4 (four) times daily as needed. 08/27/23   Leath-Warren, Sadie Haber, NP  butalbital-acetaminophen-caffeine (FIORICET,  ESGIC) 50-325-40 MG tablet Take 1 tablet by mouth daily as needed for headache or migraine.    [provider]  celecoxib (CELEBREX) 100 MG capsule Take 100 mg by mouth daily as needed for moderate pain.  Patient not taking: Reported on 02/15/2023    [provider]  cetirizine (ZYRTEC) 10 MG tablet Take 1 tablet (10 mg total) by mouth daily. 08/27/23   Leath-Warren, Sadie Haber, NP  clonazePAM (KLONOPIN) 1 MG tablet Take 1 mg by mouth 2 (two) times daily as needed for anxiety.     [provider]  Cyanocobalamin (B-12) 2500 MCG TABS Take 2,500 mcg by mouth. Patient not taking: Reported on 02/15/2023    [provider]  Diclofenac Potassium 25 MG CAPS Zipsor 25 mg capsule  TAKE (1) CAPSULE BY MOUTH EVERY SIX HOURS AS NEEDED. Patient not taking: Reported on 02/15/2023    [provider]  DULoxetine (CYMBALTA) 20 MG capsule Take 20 mg by mouth daily.    [provider]  fluconazole (DIFLUCAN) 150 MG tablet TAKE 1 TABLET BY MOUTH FOR 1 DOSE. Patient not taking: Reported on 02/15/2023 12/16/21   Kathryn Golden, CNM  fluticasone [facility]) 50 MCG/ACT nasal spray Place 2 sprays into both nostrils daily. 08/27/23   Leath-Warren, Sadie Haber, NP  HYDROcodone-acetaminophen (NORCO) 10-325 MG per tablet Take 1 tablet by mouth every 6 (six) hours as needed for moderate pain.     [provider]  lidocaine (XYLOCAINE) 2 % solution Gargle and spit 5 mL every 6 hours as needed  for throat pain or discomfort. 08/27/23   Leath-Warren, Sadie Haber, NP  methocarbamol (ROBAXIN) 500 MG tablet Take 500 mg by mouth daily as needed for muscle spasms. Patient not taking: Reported on 02/15/2023    [provider]  metoprolol tartrate (LOPRESSOR) 50 MG tablet Take 50 mg by mouth 2 (two) times daily. 08/16/21   [provider]  pravastatin (PRAVACHOL) 20 MG tablet Take 20 mg by mouth daily.    [provider]  pregabalin (LYRICA) 200 MG capsule  Take 200 mg by mouth at bedtime.    [provider]  RABEprazole (ACIPHEX) 20 MG tablet Take 1 tablet (20 mg total) by mouth daily. 12/21/17   Rourk, Gerrit Friends, MD  rizatriptan (MAXALT) 10 MG tablet Take 10 mg by mouth as needed for migraine. May repeat in 2 hours if needed    [provider]  tiZANidine (ZANAFLEX) 2 MG tablet Take 2-4 mg by mouth every 8 (eight) hours as needed.    [provider]  UBRELVY 100 MG TABS Take 1 tablet by mouth daily as needed. 11/16/21   [provider]  vitamin C (ASCORBIC ACID) 500 MG tablet Take 500 mg by mouth daily.    [provider]  Vitamin D, Ergocalciferol, (DRISDOL) 50000 units CAPS capsule Take 50,000 Units by mouth every Monday.     [provider]      Allergies    Bee venom and Percocet [oxycodone-acetaminophen]    Review of Systems   Review of Systems  Constitutional:  Negative for fever.  Gastrointestinal:  Negative for abdominal pain, diarrhea, nausea and vomiting.  Musculoskeletal:  Positive for back pain.  Neurological:  Negative for weakness and numbness.    Physical Exam Updated Vital Signs BP (!) 140/91 (BP Location: Right Arm)   Pulse 93   Temp 98.1 F (36.7 C) (Oral)   Resp 16   Ht 5\' 8"  (1.727 m)   Wt 74.8 kg   LMP 07/23/2014   SpO2 99%   BMI 25.09 kg/m  Physical Exam Vitals and nursing note reviewed.  Constitutional:      General: She is not in acute distress.    Appearance: She is well-developed.  HENT:     Head: Normocephalic and atraumatic.  Eyes:     Conjunctiva/sclera: Conjunctivae normal.  Cardiovascular:     Rate and Rhythm: Normal rate and regular rhythm.     Heart sounds: No murmur heard. Pulmonary:     Effort: Pulmonary effort is normal. No respiratory distress.     Breath sounds: Normal breath sounds.  Abdominal:     Palpations: Abdomen is soft.     Tenderness: There is no abdominal tenderness. There is no right CVA tenderness or left CVA  tenderness.  Musculoskeletal:        General: No swelling.     Cervical back: Neck supple.       Back:  Skin:    General: Skin is warm and dry.     Capillary Refill: Capillary refill takes less than 2 seconds.  Neurological:     General: No focal deficit present.     Mental Status: She is alert.     GCS: GCS eye subscore is 4. GCS verbal subscore is 5. GCS motor subscore is 6.     Cranial Nerves: Cranial nerves 2-12 are intact. No cranial nerve deficit.     Sensory: Sensation is intact. No sensory deficit.     Motor: Motor function is intact. No  weakness.     Coordination: Coordination is intact. Heel to [facility] Test normal.  Psychiatric:        Mood and Affect: Mood normal.     ED Results / Procedures / Treatments   Labs (all labs ordered are listed, but only abnormal results are displayed) Labs Reviewed  COMPREHENSIVE METABOLIC PANEL - Abnormal; Notable for the following components:      Result Value   Glucose, Bld 111 (*)    All other components within normal limits  URINALYSIS, ROUTINE W REFLEX MICROSCOPIC - Abnormal; Notable for the following components:   Color, Urine STRAW (*)    Specific Gravity, Urine 1.004 (*)    All other components within normal limits  CBC    EKG None  Radiology CT Lumbar Spine Wo Contrast Result Date: 12/08/2023 CLINICAL DATA:  Chronic low back pain, worse for 1 week. EXAM: CT LUMBAR SPINE WITHOUT CONTRAST TECHNIQUE: Multidetector CT imaging of the lumbar spine was performed without intravenous contrast administration. Multiplanar CT image reconstructions were also generated. RADIATION DOSE REDUCTION: This exam was performed according to the departmental dose-optimization program which includes automated exposure control, adjustment of the mA and/or kV according to patient size and/or use of iterative reconstruction technique. COMPARISON:  Lumbar spine radiographs dated 11/28/2022. FINDINGS: Segmentation: 5 lumbar type vertebrae. Alignment:  Normal. Vertebrae: No acute fracture or focal pathologic process. Paraspinal and other soft tissues: Negative. Disc levels: There is mild multilevel degenerative disc and joint disease. No significant neuroforaminal stenosis. IMPRESSION: Mild multilevel degenerative disc and joint disease. No significant neuroforaminal stenosis. Electronically Signed   By: Romona Curls M.D.   On: 12/08/2023 19:16    Procedures Procedures   Medications Ordered in ED Medications  ketorolac (TORADOL) 15 MG/ML injection 15 mg (15 mg Intravenous Given 12/08/23 1804)    ED Course/ Medical Decision Making/ A&P   Medical Decision Making Amount and/or Complexity of Data Reviewed Labs: ordered.  Risk Prescription drug management.   This 48 year old female presents for evaluation.  Please see HPI for further details.  On examination patient is afebrile and nontachycardic.  Her lung sounds are clear bilaterally, she is not hypoxic.  Her abdomen is soft and compressible throughout with no tenderness noted.  Negative CVA tenderness bilaterally.  Neuro examination at baseline with 5 out of 5 strength bilateral lower extremities.  She does have right-sided tenderness to her low back around her lumbar spine but no centralized lumbar spinal tenderness.  Will assess patient with CBC to assess for white count elevation, metabolic panel.  Will collect urinalysis to ensure no evidence of UTI or blood in urine.  Will also collect CT lumbar spine.  Provided Toradol for pain.  CBC without leukocytosis or anemia.  Metabolic panel without electrolyte derangement, elevated LFTs.  Anion gap 9.  CT lumbar spine shows no acute process.  On reassessment with Toradol, patient reports pain is slightly decreased.  Suspect that patient pain could be due to her underlying chronic low back pain.  Have advised her to follow-up with her pain management specialist this week.  Have encouraged rest, rehydration at home.  She reports that she has  muscle relaxers and hydrocodone at home.  She was given return precautions and she voiced understanding.  Had all her questions answered to her satisfaction.  Stable to discharge home.   Final Clinical Impression(s) / ED Diagnoses Final diagnoses:  Acute right-sided low back pain without sciatica    Rx / DC Orders ED Discharge Orders  None         Clent Ridges 12/08/23 2001    Durwin Glaze, MD 12/08/23 [phone]

## 2023-12-08 NOTE — ED Triage Notes (Signed)
Pt stated that she has always had back pain but for the last week, it has gotten worse. Pt is worried something is wrong. Pt ambulatory to triage

## 2023-12-08 NOTE — ED Notes (Signed)
Patient discharged. Provider spoke to patient. Paperwork given to patient and reviewed. Pt verbalized understanding. VSS. A+Ox4. Patient ambulated out of the ER with steady, independent gait. IV removed intact without complications.

## 2023-12-08 NOTE — Discharge Instructions (Signed)
It was a pleasure taking part in your care.  As discussed, your Is reassuring.  Please continue taking all prescribed indications at home.  Please follow-up with your pain management specialist this week for further care.  Please return to the ED with any new symptoms such as bowel or bladder incontinence, numbness in your groin, weakness of your lower legs or fevers.

## 2024-02-01 ENCOUNTER — Encounter: Payer: Self-pay | Admitting: Obstetrics & Gynecology

## 2024-02-01 ENCOUNTER — Ambulatory Visit: Admitting: Obstetrics & Gynecology

## 2024-02-01 VITALS — BP 134/88 | HR 74 | Ht 68.0 in | Wt 159.0 lb

## 2024-02-01 DIAGNOSIS — R310 Gross hematuria: Secondary | ICD-10-CM | POA: Diagnosis not present

## 2024-02-01 NOTE — Progress Notes (Signed)
 Chief Complaint  Patient presents with   gyn visit    Feel like needle stuck were pee come out,no burning.had bleeding Wednesday,thursday      48 y.o. [id number] Patient's last menstrual period was 07/23/2014. The current method of family planning is status post hysterectomy.  Outpatient Encounter Medications as of 02/01/2024  Medication Sig Note   AIMOVIG 140 MG/ML SOAJ Inject into the skin every 30 (thirty) days.    brompheniramine-pseudoephedrine-DM 30-2-10 MG/5ML syrup Take 5 mLs by mouth 4 (four) times daily as needed.    butalbital-acetaminophen-caffeine (FIORICET, ESGIC) 50-325-40 MG tablet Take 1 tablet by mouth daily as needed for headache or migraine.    celecoxib (CELEBREX) 100 MG capsule Take 100 mg by mouth daily as needed for moderate pain.  (Patient not taking: Reported on 02/15/2023)    cetirizine (ZYRTEC) 10 MG tablet Take 1 tablet (10 mg total) by mouth daily.    clonazePAM (KLONOPIN) 1 MG tablet Take 1 mg by mouth 2 (two) times daily as needed for anxiety.     Cyanocobalamin (B-12) 2500 MCG TABS Take 2,500 mcg by mouth. (Patient not taking: Reported on 02/15/2023)    Diclofenac Potassium 25 MG CAPS Zipsor 25 mg capsule  TAKE (1) CAPSULE BY MOUTH EVERY SIX HOURS AS NEEDED. (Patient not taking: Reported on 02/15/2023)    DULoxetine (CYMBALTA) 20 MG capsule Take 20 mg by mouth daily. 02/21/2018: Pt hasnt been taking recently but says she will start back    fluconazole (DIFLUCAN) 150 MG tablet TAKE 1 TABLET BY MOUTH FOR 1 DOSE. (Patient not taking: Reported on 02/15/2023)    fluticasone (FLONASE) 50 MCG/ACT nasal spray Place 2 sprays into both nostrils daily.    HYDROcodone-acetaminophen (NORCO) 10-325 MG per tablet Take 1 tablet by mouth every 6 (six) hours as needed for moderate pain.     lidocaine (XYLOCAINE) 2 % solution Gargle and spit 5 mL every 6 hours as needed for throat pain or discomfort.    methocarbamol (ROBAXIN) 500 MG tablet Take 500 mg by mouth daily as needed  for muscle spasms. (Patient not taking: Reported on 02/15/2023)    metoprolol tartrate (LOPRESSOR) 50 MG tablet Take 50 mg by mouth 2 (two) times daily.    pravastatin (PRAVACHOL) 20 MG tablet Take 20 mg by mouth daily.    pregabalin (LYRICA) 200 MG capsule Take 200 mg by mouth at bedtime.    RABEprazole (ACIPHEX) 20 MG tablet Take 1 tablet (20 mg total) by mouth daily. 02/21/2018: Take daily, protonix is prn    rizatriptan (MAXALT) 10 MG tablet Take 10 mg by mouth as needed for migraine. May repeat in 2 hours if needed    tiZANidine (ZANAFLEX) 2 MG tablet Take 2-4 mg by mouth every 8 (eight) hours as needed.    UBRELVY 100 MG TABS Take 1 tablet by mouth daily as needed.    vitamin C (ASCORBIC ACID) 500 MG tablet Take 500 mg by mouth daily.    Vitamin D, Ergocalciferol, (DRISDOL) 50000 units CAPS capsule Take 50,000 Units by mouth every Monday.     No facility-administered encounter medications on file as of 02/01/2024.    Subjective Pt had an episode of frank blood in her urine Used mirror and was coming out of urethra, not vaginally Minimal pain, kind of like a pin prick Urine today is clear, will culture Father had RCC  Past Medical History:  Diagnosis Date   Abnormal Pap smear    ADHD (attention deficit  hyperactivity disorder)    Anemia    Anxiety 03/27/2013   Breast lump 03/27/2013   Degenerative cervical disc    Depression    Diverticulosis    Dyspnea    WITH PAIN    Endometriosis    Fibromyalgia 03/27/2013   GERD (gastroesophageal reflux disease)    HPV (human papilloma virus) infection    Hx of degenerative disc disease    Migraine    PIH (pregnancy induced hypertension)    in pregnancy only   Sciatic nerve pain, right     Past Surgical History:  Procedure Laterality Date   ABDOMINAL HYSTERECTOMY     partial   BIOPSY  09/26/2017   Procedure: BIOPSY;  Surgeon: Corbin Ade, MD;  [facility];  Service: Endoscopy;;  gastric   COLONOSCOPY WITH PROPOFOL N/A  09/26/2017   Procedure: COLONOSCOPY WITH PROPOFOL;  Surgeon: Corbin Ade, MD;  [facility];  Service: Endoscopy;  Laterality: N/A;  9:30am   DILATION AND CURETTAGE OF UTERUS     dilation and curretage     ESOPHAGOGASTRODUODENOSCOPY (EGD) WITH PROPOFOL N/A 09/26/2017   Procedure: ESOPHAGOGASTRODUODENOSCOPY (EGD) WITH PROPOFOL;  Surgeon: Corbin Ade, MD;  [facility];  Service: Endoscopy;  Laterality: N/A;   HAND SURGERY Right    tendon repair   LUMBAR LAMINECTOMY/DECOMPRESSION MICRODISCECTOMY Left 03/07/2018   Procedure: Microdiscectomy - Lumbar Four-Five left;  Surgeon: Tia Alert, MD;  Location: [facility] OR;  Service: Neurosurgery;  Laterality: Left;  left   TUBAL LIGATION  07/24/2011   Procedure: POST PARTUM TUBAL LIGATION;  Surgeon: Catalina Antigua, MD;  Location: [facility];  Service: Gynecology;  Laterality: Bilateral;   VAGINAL HYSTERECTOMY N/A 09/02/2014   Procedure: HYSTERECTOMY VAGINAL;  Surgeon: Lazaro Arms, MD;  Location: [facility];  Service: Gynecology;  Laterality: N/A;    OB History     Gravida  5   Para  3   Term  3   Preterm  0   AB  2   Living  3      SAB  2   IAB  0   Ectopic  0   Multiple  0   Live Births  1           Allergies  Allergen Reactions   Bee Venom Rash   Percocet [Oxycodone-Acetaminophen] Rash    Social History   Socioeconomic History   Marital status: Divorced    Spouse name: Not on file   Number of children: 3   Years of education: Not on file   Highest education level: Not on file  Occupational History   Not on file  Tobacco Use   Smoking status: Former    Current packs/day: 1.00    Average packs/day: 1 pack/day for 20.0 years (20.0 ttl pk-yrs)    Types: Cigarettes   Smokeless tobacco: Never  Vaping Use   Vaping status: Every Day  Substance and Sexual Activity   Alcohol use: No   Drug use: No   Sexual activity: Yes    Birth control/protection: Surgical    Comment: hyst  Other Topics  Concern   Not on file  Social History Narrative   Not on file   Social Drivers of Health   Financial Resource Strain: Medium Risk (02/15/2023)   Overall Financial Resource Strain (CARDIA)    Difficulty of Paying Living Expenses: Somewhat hard  Food Insecurity: Food Insecurity Present (02/15/2023)   Hunger Vital Sign  Worried About Programme researcher, broadcasting/film/video in the Last Year: Sometimes true    [employer] of Food in the Last Year: Sometimes true  Transportation Needs: No Transportation Needs (02/15/2023)   PRAPARE - Administrator, Civil Service (Medical): No    Lack of Transportation (Non-Medical): No  Physical Activity: Insufficiently Active (02/15/2023)   Exercise Vital Sign    Days of Exercise per Week: 6 days    Minutes of Exercise per Session: 20 min  Stress: Patient Declined (02/15/2023)   [employer] of Occupational Health - Occupational Stress Questionnaire    Feeling of Stress : Patient declined  Social Connections: Socially Isolated (02/15/2023)   Social Connection and Isolation Panel [NHANES]    Frequency of Communication with Friends and Family: Once a week    Frequency of Social Gatherings with Friends and Family: Once a week    Attends Religious Services: 1 to 4 times per year    Active Member of [employer] or Organizations: No    Attends Banker Meetings: Never    Marital Status: Divorced    Family History  Problem Relation Age of Onset   Cancer Father        kidney    Hypertension Father    Heart disease Father    Hyperlipidemia Father    Other Brother        Factor 13 def.   Clotting disorder Brother    Hyperlipidemia Paternal Grandfather    Heart disease Paternal Grandfather    Ulcers Brother    Hypertension Other    Inflammatory bowel disease Neg Hx    Celiac disease Neg Hx    Colon cancer Neg Hx     Medications:       Current Outpatient Medications:    AIMOVIG 140 MG/ML SOAJ, Inject into the skin every 30 (thirty) days., Disp: ,  Rfl:    brompheniramine-pseudoephedrine-DM 30-2-10 MG/5ML syrup, Take 5 mLs by mouth 4 (four) times daily as needed., Disp: 140 mL, Rfl: 0   butalbital-acetaminophen-caffeine (FIORICET, ESGIC) 50-325-40 MG tablet, Take 1 tablet by mouth daily as needed for headache or migraine., Disp: , Rfl:    celecoxib (CELEBREX) 100 MG capsule, Take 100 mg by mouth daily as needed for moderate pain.  (Patient not taking: Reported on 02/15/2023), Disp: , Rfl:    cetirizine (ZYRTEC) 10 MG tablet, Take 1 tablet (10 mg total) by mouth daily., Disp: 30 tablet, Rfl: 0   clonazePAM (KLONOPIN) 1 MG tablet, Take 1 mg by mouth 2 (two) times daily as needed for anxiety. , Disp: , Rfl:    Cyanocobalamin (B-12) 2500 MCG TABS, Take 2,500 mcg by mouth. (Patient not taking: Reported on 02/15/2023), Disp: , Rfl:    Diclofenac Potassium 25 MG CAPS, Zipsor 25 mg capsule  TAKE (1) CAPSULE BY MOUTH EVERY SIX HOURS AS NEEDED. (Patient not taking: Reported on 02/15/2023), Disp: , Rfl:    DULoxetine (CYMBALTA) 20 MG capsule, Take 20 mg by mouth daily., Disp: , Rfl:    fluconazole (DIFLUCAN) 150 MG tablet, TAKE 1 TABLET BY MOUTH FOR 1 DOSE. (Patient not taking: Reported on 02/15/2023), Disp: 1 tablet, Rfl: 0   fluticasone (FLONASE) 50 MCG/ACT nasal spray, Place 2 sprays into both nostrils daily., Disp: 16 g, Rfl: 0   HYDROcodone-acetaminophen (NORCO) 10-325 MG per tablet, Take 1 tablet by mouth every 6 (six) hours as needed for moderate pain. , Disp: , Rfl:    lidocaine (XYLOCAINE) 2 % solution, Gargle and spit  5 mL every 6 hours as needed for throat pain or discomfort., Disp: 100 mL, Rfl: 0   methocarbamol (ROBAXIN) 500 MG tablet, Take 500 mg by mouth daily as needed for muscle spasms. (Patient not taking: Reported on 02/15/2023), Disp: , Rfl:    metoprolol tartrate (LOPRESSOR) 50 MG tablet, Take 50 mg by mouth 2 (two) times daily., Disp: , Rfl:    pravastatin (PRAVACHOL) 20 MG tablet, Take 20 mg by mouth daily., Disp: , Rfl:    pregabalin  (LYRICA) 200 MG capsule, Take 200 mg by mouth at bedtime., Disp: , Rfl:    RABEprazole (ACIPHEX) 20 MG tablet, Take 1 tablet (20 mg total) by mouth daily., Disp: 30 tablet, Rfl: 11   rizatriptan (MAXALT) 10 MG tablet, Take 10 mg by mouth as needed for migraine. May repeat in 2 hours if needed, Disp: , Rfl:    tiZANidine (ZANAFLEX) 2 MG tablet, Take 2-4 mg by mouth every 8 (eight) hours as needed., Disp: , Rfl:    UBRELVY 100 MG TABS, Take 1 tablet by mouth daily as needed., Disp: , Rfl:    vitamin C (ASCORBIC ACID) 500 MG tablet, Take 500 mg by mouth daily., Disp: , Rfl:    Vitamin D, Ergocalciferol, (DRISDOL) 50000 units CAPS capsule, Take 50,000 Units by mouth every Monday. , Disp: , Rfl:   Objective Blood pressure 134/88, pulse 74, height 5\' 8"  (1.727 m), weight 159 lb (72.1 kg), last menstrual period 07/23/2014.  General WDWN female NAD Vulva:  normal appearing vulva with no masses, tenderness or lesions Vagina:  normal mucosa, no discharge, no granulation tissue or polyps Cervix:  absent Uterus:  absent Adnexa: ovaries:present,  normal adnexa in size, nontender and no masses   Pertinent ROS No burning with urination, frequency or urgency No nausea, vomiting or diarrhea Nor fever chills or other constitutional symptoms   Labs or studies Urine clear cultured    Impression + Management Plan: Diagnoses this Encounter::   ICD-10-CM   1. Gross hematuria  R31.0 Urine Culture    Ambulatory referral to Urology   No history of renal calculi and presentation is not consistent with that.  will refer to Dr Ronne Binning for evaluation which may include cystoscopy, FH RCC        Medications prescribed during  this encounter: No orders of the defined types were placed in this encounter.   Labs or Scans Ordered during this encounter: Orders Placed This Encounter  Procedures   Urine Culture   Ambulatory referral to Urology      Follow up Return if symptoms worsen or fail to  improve.

## 2024-02-03 LAB — URINE CULTURE

## 2024-03-14 ENCOUNTER — Ambulatory Visit: Admitting: Urology

## 2024-03-14 ENCOUNTER — Encounter: Payer: Self-pay | Admitting: Urology

## 2024-03-14 VITALS — BP 122/71 | HR 91

## 2024-03-14 DIAGNOSIS — C649 Malignant neoplasm of unspecified kidney, except renal pelvis: Secondary | ICD-10-CM

## 2024-03-14 DIAGNOSIS — R310 Gross hematuria: Secondary | ICD-10-CM | POA: Diagnosis not present

## 2024-03-14 LAB — URINALYSIS, ROUTINE W REFLEX MICROSCOPIC
Bilirubin, UA: NEGATIVE
Glucose, UA: NEGATIVE
Ketones, UA: NEGATIVE
Leukocytes,UA: NEGATIVE
Nitrite, UA: NEGATIVE
Protein,UA: NEGATIVE
RBC, UA: NEGATIVE
Specific Gravity, UA: 1.03 (ref 1.005–1.030)
Urobilinogen, Ur: 0.2 mg/dL (ref 0.2–1.0)
pH, UA: 6 (ref 5.0–7.5)

## 2024-03-14 NOTE — Progress Notes (Signed)
 03/14/2024 11:17 AM   Kathryn Golden 06-20-1976 [id number]  Referring provider: Wendelyn Halter, MD [address] [place],  [place] [address]  Gross hematuria   HPI: [name] is 46IH here for evaluation of gross hematuria. 4-5 weeks ago she developed gross painless hematuria for 2 days. She has a family history of renal cell cancer. Her father was diagnosed at age 46 with stage 4 RCC. She has be vaping for 8 years and she has a 20pk year smoking history. No history of nephrolithiasis.     PMH: Past Medical History:  Diagnosis Date   Abnormal Pap smear    ADHD (attention deficit hyperactivity disorder)    Anemia    Anxiety 03/27/2013   Breast lump 03/27/2013   Degenerative cervical disc    Depression    Diverticulosis    Dyspnea    WITH PAIN    Endometriosis    Fibromyalgia 03/27/2013   GERD (gastroesophageal reflux disease)    HPV (human papilloma virus) infection    Hx of degenerative disc disease    Migraine    PIH (pregnancy induced hypertension)    in pregnancy only   Sciatic nerve pain, right     Surgical History: Past Surgical History:  Procedure Laterality Date   ABDOMINAL HYSTERECTOMY     partial   BIOPSY  09/26/2017   Procedure: BIOPSY;  Surgeon: Kathryn Espy, MD;  [facility];  Service: Endoscopy;;  gastric   COLONOSCOPY WITH PROPOFOL  N/A 09/26/2017   Procedure: COLONOSCOPY WITH PROPOFOL ;  Surgeon: Kathryn Espy, MD;  [facility];  Service: Endoscopy;  Laterality: N/A;  9:30am   DILATION AND CURETTAGE OF UTERUS     dilation and curretage     ESOPHAGOGASTRODUODENOSCOPY (EGD) WITH PROPOFOL  N/A 09/26/2017   Procedure: ESOPHAGOGASTRODUODENOSCOPY (EGD) WITH PROPOFOL ;  Surgeon: Kathryn Espy, MD;  [facility];  Service: Endoscopy;  Laterality: N/A;   HAND SURGERY Right    tendon repair   LUMBAR LAMINECTOMY/DECOMPRESSION MICRODISCECTOMY Left 03/07/2018   Procedure: Microdiscectomy - Lumbar Four-Five left;  Surgeon: Kathryn Mar, MD;  Location: [facility] OR;  Service: Neurosurgery;  Laterality: Left;  left   TUBAL LIGATION  07/24/2011   Procedure: POST PARTUM TUBAL LIGATION;  Surgeon: Kathryn Goad, MD;  Location: [facility];  Service: Gynecology;  Laterality: Bilateral;   VAGINAL HYSTERECTOMY N/A 09/02/2014   Procedure: HYSTERECTOMY VAGINAL;  Surgeon: Kathryn Halter, MD;  Location: [facility];  Service: Gynecology;  Laterality: N/A;    Home Medications:  Allergies as of 03/14/2024       Reactions   Bee Venom Rash   Percocet [oxycodone -acetaminophen ] Rash        Medication List        Accurate as of Mar 14, 2024 11:17 AM. If you have any questions, ask your nurse or doctor.          Aimovig 140 MG/ML Soaj Generic drug: Erenumab-aooe Inject into the skin every 30 (thirty) days.   ascorbic acid 500 MG tablet Commonly known as: VITAMIN C Take 500 mg by mouth daily.   B-12 2500 MCG Tabs Take 2,500 mcg by mouth.   brompheniramine-pseudoephedrine-DM 30-2-10 MG/5ML syrup Take 5 mLs by mouth 4 (four) times daily as needed.   butalbital-acetaminophen -caffeine  50-325-40 MG tablet Commonly known as: FIORICET Take 1 tablet by mouth daily as needed for headache or migraine.   celecoxib  100 MG capsule Commonly known as: CELEBREX  Take 100 mg by  mouth daily as needed for moderate pain.   cetirizine  10 MG tablet Commonly known as: ZYRTEC  Take 1 tablet (10 mg total) by mouth daily.   clonazePAM  1 MG tablet Commonly known as: KLONOPIN  Take 1 mg by mouth 2 (two) times daily as needed for anxiety.   Diclofenac Potassium 25 MG Caps Zipsor 25 mg capsule  TAKE (1) CAPSULE BY MOUTH EVERY SIX HOURS AS NEEDED.   DULoxetine  20 MG capsule Commonly known as: CYMBALTA  Take 20 mg by mouth daily.   fluconazole  150 MG tablet Commonly known as: DIFLUCAN  TAKE 1 TABLET BY MOUTH FOR 1 DOSE.   fluticasone  50 MCG/ACT nasal spray Commonly known as: FLONASE  Place 2 sprays into both nostrils daily.    HYDROcodone -acetaminophen  10-325 MG tablet Commonly known as: NORCO Take 1 tablet by mouth every 6 (six) hours as needed for moderate pain.   lidocaine  2 % solution Commonly known as: XYLOCAINE  Gargle and spit 5 mL every 6 hours as needed for throat pain or discomfort.   methocarbamol  500 MG tablet Commonly known as: ROBAXIN  Take 500 mg by mouth daily as needed for muscle spasms.   metoprolol tartrate 50 MG tablet Commonly known as: LOPRESSOR Take 50 mg by mouth 2 (two) times daily.   pravastatin 20 MG tablet Commonly known as: PRAVACHOL Take 20 mg by mouth daily.   pregabalin  200 MG capsule Commonly known as: LYRICA  Take 200 mg by mouth at bedtime.   RABEprazole  20 MG tablet Commonly known as: ACIPHEX  Take 1 tablet (20 mg total) by mouth daily.   rizatriptan 10 MG tablet Commonly known as: MAXALT Take 10 mg by mouth as needed for migraine. May repeat in 2 hours if needed   tiZANidine 2 MG tablet Commonly known as: ZANAFLEX Take 2-4 mg by mouth every 8 (eight) hours as needed.   Ubrelvy 100 MG Tabs Generic drug: Ubrogepant Take 1 tablet by mouth daily as needed.   Vitamin D (Ergocalciferol) 1.25 MG (50000 UNIT) Caps capsule Commonly known as: DRISDOL Take 50,000 Units by mouth every Monday.        Allergies:  Allergies  Allergen Reactions   Bee Venom Rash   Percocet [Oxycodone -Acetaminophen ] Rash    Family History: Family History  Problem Relation Age of Onset   Cancer Father        kidney    Hypertension Father    Heart disease Father    Hyperlipidemia Father    Other Brother        Factor 13 def.   Clotting disorder Brother    Hyperlipidemia Paternal Grandfather    Heart disease Paternal Grandfather    Ulcers Brother    Hypertension Other    Inflammatory bowel disease Neg Hx    Celiac disease Neg Hx    Colon cancer Neg Hx     Social History:  reports that she has quit smoking. Her smoking use included cigarettes. She has a 20 pack-year  smoking history. She has never used smokeless tobacco. She reports that she does not drink alcohol and does not use drugs.  ROS: All other review of systems were reviewed and are negative except what is noted above in HPI  Physical Exam: BP 122/71   Pulse 91   LMP 07/23/2014   Constitutional:  Alert and oriented, No acute distress. HEENT: [place] AT, moist mucus membranes.  Trachea midline, no masses. Cardiovascular: No clubbing, cyanosis, or edema. Respiratory: Normal respiratory effort, no increased work of breathing. GI: Abdomen is soft, nontender, nondistended,  no abdominal masses GU: No CVA tenderness.  Lymph: No cervical or inguinal lymphadenopathy. Skin: No rashes, bruises or suspicious lesions. Neurologic: Grossly intact, no focal deficits, moving all 4 extremities. Psychiatric: Normal mood and affect.  Laboratory Data: Lab Results  Component Value Date   WBC 6.2 12/08/2023   HGB 14.2 12/08/2023   HCT 43.2 12/08/2023   MCV 90.0 12/08/2023   PLT 248 12/08/2023    Lab Results  Component Value Date   CREATININE 0.56 12/08/2023    No results found for: "PSA"  No results found for: "TESTOSTERONE"  No results found for: "HGBA1C"  Urinalysis    Component Value Date/Time   COLORURINE STRAW (A) 12/08/2023 1759   APPEARANCEUR CLEAR 12/08/2023 1759   APPEARANCEUR Clear 08/18/2021 1155   LABSPEC 1.004 (L) 12/08/2023 1759   PHURINE 5.0 12/08/2023 1759   GLUCOSEU NEGATIVE 12/08/2023 1759   HGBUR NEGATIVE 12/08/2023 1759   BILIRUBINUR NEGATIVE 12/08/2023 1759   BILIRUBINUR Negative 08/18/2021 1155   KETONESUR NEGATIVE 12/08/2023 1759   PROTEINUR NEGATIVE 12/08/2023 1759   UROBILINOGEN 0.2 12/12/2014 1937   NITRITE NEGATIVE 12/08/2023 1759   LEUKOCYTESUR NEGATIVE 12/08/2023 1759    Lab Results  Component Value Date   LABMICR Comment 08/18/2021   BACTERIA RARE 08/28/2014    Pertinent Imaging:  No results found for this or any previous visit.  No results found  for this or any previous visit.  No results found for this or any previous visit.  No results found for this or any previous visit.  No results found for this or any previous visit.  No results found for this or any previous visit.  No results found for this or any previous visit.  No results found for this or any previous visit.   Assessment & Plan:    1. Gross hematuria (Primary) BMP Urine cytology CT hematuria Next available office cystoscopy - Urinalysis, Routine w reflex microscopic   No follow-ups on file.  Johnie Nailer, MD  [facility] [place]

## 2024-03-14 NOTE — Patient Instructions (Signed)
 Blood in the Pee (Hematuria) in Adults: What to Know  Hematuria is blood in the pee. You may be able to see blood in the pee. In some cases, a health care provider may find blood with a test.  Blood in the pee can be caused by infections of the kidney, bladder, or the urethra. The urethra is the tube that drains pee from the bladder.  Other causes may include: Kidney stones. Infection of the prostate. Cancer. Too much calcium in the pee. Conditions that are passed from parent to child. Too much exercise. Infections can be treated with medicine. A kidney stone will usually leave your body when you pee. If infections or kidney stones didn't cause the blood in the urine, then more tests may be needed. It is very important to tell your provider about any blood in your pee, even if you have no pain or the blood stops with no treatment. Blood in the pee can be a sign of a very serious problem, such as cancer. Follow these instructions at home: Medicines Take your medicines only as told. If you were given antibiotics, take them as told. Do not stop taking them even if you start to feel better. Eating and drinking Drink more fluids as told. Aim to drink 3-4 quarts (2.8-3.8 L) a day. Avoid caffeine, tea, and carbonated drinks. These can bother the bladder. Avoid alcohol if a female because it may irritate the prostate. General instructions If you have been diagnosed with a kidney stone, strain your pee to catch the stone if told by your provider. Empty your bladder often. Avoid holding pee for a long time. If you're female, make sure that: You wipe from front to back after using the bathroom. You use each piece of toilet paper only once. You pee before and after sex. It's up to you to get the results of any tests. Ask when your results will be ready and how to get them. You may need to call or meet with your provider to get your results. Keep all follow-up visits. Your provider will need to know  about any changes or any new symptoms. Contact a health care provider if: Your symptoms don't get better after 3 days. Your symptoms get worse. You have back pain or belly pain. You have a fever or chills. You throw up or feel like you may throw up. You throw up every time you take medicine. Get help right away if: You pass blood clots in your pee. You pass out. These symptoms may be an emergency. Call 911 right away. Do not wait to see if the symptoms will go away. Do not drive yourself to the hospital. This information is not intended to replace advice given to you by your health care provider. Make sure you discuss any questions you have with your health care provider. Document Revised: 07/26/2023 Document Reviewed: 07/05/2023 Elsevier Patient Education  2024 [employer].

## 2024-03-15 LAB — BASIC METABOLIC PANEL WITH GFR
BUN/Creatinine Ratio: 16 (ref 9–23)
BUN: 11 mg/dL (ref 6–24)
CO2: 22 mmol/L (ref 20–29)
Calcium: 10.4 mg/dL — ABNORMAL HIGH (ref 8.7–10.2)
Chloride: 103 mmol/L (ref 96–106)
Creatinine, Ser: 0.67 mg/dL (ref 0.57–1.00)
Glucose: 105 mg/dL — ABNORMAL HIGH (ref 70–99)
Potassium: 4.6 mmol/L (ref 3.5–5.2)
Sodium: 141 mmol/L (ref 134–144)
eGFR: 110 mL/min/{1.73_m2} (ref >59)

## 2024-03-18 LAB — CYTOLOGY, URINE

## 2024-03-25 ENCOUNTER — Ambulatory Visit: Payer: Self-pay

## 2024-04-24 ENCOUNTER — Other Ambulatory Visit (HOSPITAL_COMMUNITY)
Admission: RE | Admit: 2024-04-24 | Discharge: 2024-04-24 | Disposition: A | Discharge status: Home / Self Care | Source: Ambulatory Visit | Attending: Obstetrics & Gynecology | Admitting: Obstetrics & Gynecology

## 2024-04-24 ENCOUNTER — Ambulatory Visit

## 2024-04-24 DIAGNOSIS — N898 Other specified noninflammatory disorders of vagina: Secondary | ICD-10-CM | POA: Diagnosis present

## 2024-04-24 MED ORDER — FLUCONAZOLE 150 MG PO TABS: 150.0000 mg | ORAL_TABLET | Freq: Once | ORAL | 0 refills | Status: AC

## 2024-04-24 NOTE — Progress Notes (Signed)
   NURSE VISIT- VAGINITIS/STD/POC  SUBJECTIVE:  Kathryn Golden is a 48 y.o. [id number] GYN patientfemale here for a vaginal swab for vaginitis screening.  She reports the following symptoms: local irritation and vulvar itching for several days . Denies abnormal vaginal bleeding, significant pelvic pain, fever, or UTI symptoms.  OBJECTIVE:  LMP 07/23/2014   Appears well, in no apparent distress  ASSESSMENT: Vaginal swab for vaginitis screening  PLAN: Self-collected vaginal probe for Gonorrhea, Chlamydia, Trichomonas, Bacterial Vaginosis, Yeast sent to lab Treatment: to be determined once results are received Follow-up as needed if symptoms persist/worsen, or new symptoms develop  Alan LITTIE Fischer  04/24/2024 1:32 PM

## 2024-04-28 ENCOUNTER — Ambulatory Visit: Payer: Self-pay | Admitting: Obstetrics & Gynecology

## 2024-04-28 ENCOUNTER — Ambulatory Visit (HOSPITAL_COMMUNITY): Attending: Urology

## 2024-04-28 LAB — CERVICOVAGINAL ANCILLARY ONLY
Bacterial Vaginitis (gardnerella): NEGATIVE
Candida Glabrata: NEGATIVE
Candida Vaginitis: POSITIVE — AB
Chlamydia: NEGATIVE
Comment: NEGATIVE
Comment: NEGATIVE
Comment: NEGATIVE
Comment: NEGATIVE
Comment: NEGATIVE
Comment: NORMAL
Neisseria Gonorrhea: NEGATIVE
Trichomonas: NEGATIVE

## 2024-04-28 MED ORDER — FLUCONAZOLE 150 MG PO TABS: ORAL_TABLET | ORAL | 1 refills | Status: DC

## 2024-05-16 ENCOUNTER — Telehealth: Payer: Self-pay

## 2024-05-16 NOTE — Telephone Encounter (Signed)
 I called patient to follow up on her CT that was needed prior to her Cysto. Patient is aware we will reschedule her appt and she will need to schedule CT per MD instructions.

## 2024-05-21 ENCOUNTER — Other Ambulatory Visit: Admitting: Urology

## 2024-05-21 NOTE — Telephone Encounter (Signed)
 I will call her and give her central scheduling number to schedule CT

## 2024-06-18 ENCOUNTER — Other Ambulatory Visit: Admitting: Urology

## 2024-07-24 ENCOUNTER — Ambulatory Visit (HOSPITAL_COMMUNITY)

## 2024-07-30 ENCOUNTER — Ambulatory Visit (HOSPITAL_COMMUNITY)
Admission: RE | Admit: 2024-07-30 | Discharge: 2024-07-30 | Disposition: A | Discharge status: Home / Self Care | Source: Ambulatory Visit | Attending: Urology | Admitting: Urology

## 2024-07-30 DIAGNOSIS — R310 Gross hematuria: Secondary | ICD-10-CM | POA: Diagnosis present

## 2024-07-30 DIAGNOSIS — R1030 Lower abdominal pain, unspecified: Secondary | ICD-10-CM | POA: Diagnosis present

## 2024-07-30 LAB — POCT I-STAT CREATININE: Creatinine, Ser: 0.7 mg/dL (ref 0.44–1.00)

## 2024-07-30 MED ORDER — IOHEXOL 300 MG/ML  SOLN
125.0000 mL | Freq: Once | INTRAMUSCULAR | Status: AC | PRN
  Administered 2024-07-30: 125 mL via INTRAVENOUS

## 2024-08-06 ENCOUNTER — Encounter: Payer: Self-pay | Admitting: Urology

## 2024-08-06 ENCOUNTER — Ambulatory Visit: Admitting: Urology

## 2024-08-06 VITALS — BP 130/87 | HR 89

## 2024-08-06 DIAGNOSIS — N200 Calculus of kidney: Secondary | ICD-10-CM

## 2024-08-06 DIAGNOSIS — R310 Gross hematuria: Secondary | ICD-10-CM | POA: Diagnosis not present

## 2024-08-06 DIAGNOSIS — T3695XA Adverse effect of unspecified systemic antibiotic, initial encounter: Secondary | ICD-10-CM

## 2024-08-06 MED ORDER — FLUCONAZOLE 150 MG PO TABS: 150.0000 mg | ORAL_TABLET | Freq: Once | ORAL | 0 refills | Status: AC

## 2024-08-06 MED ORDER — CIPROFLOXACIN HCL 500 MG PO TABS
500.0000 mg | ORAL_TABLET | Freq: Once | ORAL | Status: AC
  Administered 2024-08-06: 500 mg via ORAL

## 2024-08-06 NOTE — Progress Notes (Signed)
   08/06/24  CC: hematuria   HPI: Kathryn Golden is a 48yo here for followup for gross hematuria. CT showed small bilateral renal calculi Last menstrual period 07/23/2014. NED. A&Ox3.   No respiratory distress   Abd soft, NT, ND Normal external genitalia with patent urethral meatus  Cystoscopy Procedure Note  Patient identification was confirmed, informed consent was obtained, and patient was prepped using Betadine solution.  Lidocaine  jelly was administered per urethral meatus.    Procedure: - Flexible cystoscope introduced, without any difficulty.   - Thorough search of the bladder revealed:    normal urethral meatus    normal urothelium    no stones    no ulcers     no tumors    no urethral polyps    no trabeculation  - Ureteral orifices were normal in position and appearance.  Post-Procedure: - Patient tolerated the procedure well  Assessment/ Plan: Followup 6 months with [facility]   No follow-ups on file.  Belvie Clara, MD

## 2024-08-06 NOTE — Patient Instructions (Signed)

## 2024-08-07 LAB — URINALYSIS, ROUTINE W REFLEX MICROSCOPIC
Bilirubin, UA: NEGATIVE
Glucose, UA: NEGATIVE
Ketones, UA: NEGATIVE
Leukocytes,UA: NEGATIVE
Nitrite, UA: NEGATIVE
Protein,UA: NEGATIVE
RBC, UA: NEGATIVE
Specific Gravity, UA: 1.02 (ref 1.005–1.030)
Urobilinogen, Ur: 0.2 mg/dL (ref 0.2–1.0)
pH, UA: 6 (ref 5.0–7.5)

## 2024-08-25 ENCOUNTER — Other Ambulatory Visit (HOSPITAL_COMMUNITY)
Admission: RE | Admit: 2024-08-25 | Discharge: 2024-08-25 | Disposition: A | Discharge status: Home / Self Care | Source: Ambulatory Visit | Attending: Adult Health | Admitting: Adult Health

## 2024-08-25 ENCOUNTER — Ambulatory Visit: Admitting: Adult Health

## 2024-08-25 ENCOUNTER — Encounter: Payer: Self-pay | Admitting: Adult Health

## 2024-08-25 VITALS — BP 142/89 | HR 82 | Ht 69.0 in | Wt 154.0 lb

## 2024-08-25 DIAGNOSIS — Z113 Encounter for screening for infections with a predominantly sexual mode of transmission: Secondary | ICD-10-CM | POA: Insufficient documentation

## 2024-08-25 DIAGNOSIS — R300 Dysuria: Secondary | ICD-10-CM

## 2024-08-25 DIAGNOSIS — Z90710 Acquired absence of both cervix and uterus: Secondary | ICD-10-CM

## 2024-08-25 DIAGNOSIS — N898 Other specified noninflammatory disorders of vagina: Secondary | ICD-10-CM | POA: Diagnosis present

## 2024-08-25 DIAGNOSIS — R350 Frequency of micturition: Secondary | ICD-10-CM

## 2024-08-25 DIAGNOSIS — R8290 Unspecified abnormal findings in urine: Secondary | ICD-10-CM | POA: Diagnosis not present

## 2024-08-25 DIAGNOSIS — N6321 Unspecified lump in the left breast, upper outer quadrant: Secondary | ICD-10-CM | POA: Diagnosis not present

## 2024-08-25 DIAGNOSIS — I10 Essential (primary) hypertension: Secondary | ICD-10-CM

## 2024-08-25 LAB — POCT URINALYSIS DIPSTICK
Blood, UA: NEGATIVE
Glucose, UA: NEGATIVE
Ketones, UA: NEGATIVE
Leukocytes, UA: NEGATIVE
Nitrite, UA: NEGATIVE
Protein, UA: POSITIVE — AB

## 2024-08-25 MED ORDER — FLUCONAZOLE 150 MG PO TABS: ORAL_TABLET | ORAL | 1 refills | Status: AC

## 2024-08-25 MED ORDER — SULFAMETHOXAZOLE-TRIMETHOPRIM 800-160 MG PO TABS: 1.0000 | ORAL_TABLET | Freq: Two times a day (BID) | ORAL | 0 refills | Status: AC

## 2024-08-25 NOTE — Progress Notes (Signed)
 Subjective:     Patient ID: Kathryn Golden, female   DOB: 09-05-1976, 48 y.o.   MRN: [id number]  HPI Kathryn Golden is a 48 year old white female, divorced, sp hysterectomy in complaining of left breast lump,noticed Thursday last week and has abnormal odor to urine and urinary frequency and vaginal discharge.  PCP is Dr Luke   Review of Systems +left breast lump,noticed Thursday last week  +abnormal odor to urine and urinary frequency and some burning +vaginal discharge. Reviewed past medical,surgical, social and family history. Reviewed medications and allergies.     Objective:   Physical Exam BP (!) 142/89 (BP Location: Right Arm, Patient Position: Sitting, Cuff Size: Normal)   Pulse 82   Ht 5' 9 (1.753 m)   Wt 154 lb (69.9 kg)   LMP 07/23/2014   BMI 22.74 kg/m  Urine dipstick was 1+ protein, Skin warm and dry. Lungs: clear to ausculation bilaterally. Cardiovascular: regular rate and rhythm.   Breasts:no dominate palpable mass, retraction or nipple discharge on the right, on the left, no retraction or nipple discharge, has 4 x 3 cm firm mobile, tender mass at about 2 o'clock. She performed self swab. Fall risk is low  [facility] - 08/25/24 1152       Pregnancy Intention Screening   Does the patient want to become pregnant in the next year? N/A    Does the patient's partner want to become pregnant in the next year? N/A    Would the patient like to discuss contraceptive options today? N/A      Contraception Wrap Up   Current Method Female Sterilization   hyst   End Method Female Sterilization   hyst   Contraception Counseling Provided No             Assessment:     1. Abnormal urine odor +odor in urine, UA C&S sent  - POCT Urinalysis Dipstick - Urine Culture - Urinalysis, Routine w reflex microscopic  2. Urinary frequency +UF, urine sent for UA C&S  - POCT Urinalysis Dipstick - Urine Culture - Urinalysis, Routine w reflex microscopic  3. Burning with urination Urine  sent for UA C&S  - POCT Urinalysis Dipstick - Urine Culture - Urinalysis, Routine w reflex microscopic Will rx septra ds 1 bid x 7 days and she requests diflucan  Meds ordered this encounter  Medications   sulfamethoxazole-trimethoprim (BACTRIM DS) 800-160 MG tablet    Sig: Take 1 tablet by mouth 2 (two) times daily. Take 1 bid    Dispense:  14 tablet    Refill:  0    Supervising Provider:   JAYNE MINDER H [[id number]]   fluconazole  (DIFLUCAN ) 150 MG tablet    Sig: Take 1 now and 1 in 3 days    Dispense:  2 tablet    Refill:  1    Supervising Provider:   JAYNE, LUTHER H [[id number]]    4. Vaginal discharge Has discharge per pt CV swab sent  - Cervicovaginal ancillary only( [facility])  5. Screening examination for STD (sexually transmitted disease) CV swab sent for GC/CHL,trich,BV and yeast  - Cervicovaginal ancillary only( )  6. Mass of upper outer quadrant of left breast (Primary) + 4 x 3 cm firm mobile, tender mass at about 2 o'clock Scheduled diagnostic mammogram and US  at [facility] [address] 09/09/24 at 2:20 pm  - US  LIMITED ULTRASOUND INCLUDING AXILLA RIGHT BREAST; Future - MM 3D DIAGNOSTIC MAMMOGRAM BILATERAL BREAST; Future - US  LIMITED ULTRASOUND INCLUDING AXILLA LEFT BREAST ;  Future  7. Hypertension, unspecified type Did not take meds yet, go home and take lopressor and follow with PCP   8. S/P vaginal hysterectomy     Plan:     Follow up prn

## 2024-08-26 ENCOUNTER — Ambulatory Visit: Payer: Self-pay | Admitting: Adult Health

## 2024-08-26 LAB — URINALYSIS, ROUTINE W REFLEX MICROSCOPIC
Bilirubin, UA: NEGATIVE
Glucose, UA: NEGATIVE
Leukocytes,UA: NEGATIVE
Nitrite, UA: NEGATIVE
RBC, UA: NEGATIVE
Specific Gravity, UA: 1.029 (ref 1.005–1.030)
Urobilinogen, Ur: 0.2 mg/dL (ref 0.2–1.0)
pH, UA: 6 (ref 5.0–7.5)

## 2024-08-26 LAB — CERVICOVAGINAL ANCILLARY ONLY
Bacterial Vaginitis (gardnerella): NEGATIVE
Candida Glabrata: NEGATIVE
Candida Vaginitis: NEGATIVE
Chlamydia: NEGATIVE
Comment: NEGATIVE
Comment: NEGATIVE
Comment: NEGATIVE
Comment: NEGATIVE
Comment: NEGATIVE
Comment: NORMAL
Neisseria Gonorrhea: NEGATIVE
Trichomonas: NEGATIVE

## 2024-08-27 LAB — URINE CULTURE

## 2024-09-09 ENCOUNTER — Ambulatory Visit (HOSPITAL_COMMUNITY)
Admission: RE | Admit: 2024-09-09 | Discharge: 2024-09-09 | Disposition: A | Discharge status: Home / Self Care | Source: Ambulatory Visit | Attending: Adult Health | Admitting: Adult Health

## 2024-09-09 DIAGNOSIS — N6321 Unspecified lump in the left breast, upper outer quadrant: Secondary | ICD-10-CM

## 2025-02-04 ENCOUNTER — Ambulatory Visit: Admitting: Urology
